# Patient Record
Sex: Female | Born: 1980 | Race: White | Hispanic: No | Marital: Married | Smoking: Never smoker
Health system: Southern US, Community
[De-identification: ages and names within clinical notes are randomized; demographics above are authoritative.]

## PROBLEM LIST (undated history)

## (undated) ENCOUNTER — Inpatient Hospital Stay (HOSPITAL_COMMUNITY): Payer: Self-pay

## (undated) DIAGNOSIS — R87619 Unspecified abnormal cytological findings in specimens from cervix uteri: Secondary | ICD-10-CM

## (undated) DIAGNOSIS — Z8619 Personal history of other infectious and parasitic diseases: Secondary | ICD-10-CM

## (undated) DIAGNOSIS — R87629 Unspecified abnormal cytological findings in specimens from vagina: Secondary | ICD-10-CM

## (undated) DIAGNOSIS — Z87442 Personal history of urinary calculi: Secondary | ICD-10-CM

## (undated) DIAGNOSIS — IMO0002 Reserved for concepts with insufficient information to code with codable children: Secondary | ICD-10-CM

## (undated) HISTORY — DX: Reserved for concepts with insufficient information to code with codable children: IMO0002

## (undated) HISTORY — DX: Unspecified abnormal cytological findings in specimens from cervix uteri: R87.619

## (undated) HISTORY — DX: Personal history of urinary calculi: Z87.442

---

## 2008-03-11 ENCOUNTER — Emergency Department: Payer: Self-pay | Admitting: Emergency Medicine

## 2008-03-11 ENCOUNTER — Other Ambulatory Visit: Payer: Self-pay

## 2010-04-22 ENCOUNTER — Ambulatory Visit: Payer: Self-pay | Admitting: Internal Medicine

## 2011-01-02 LAB — ABO/RH

## 2011-01-02 LAB — GC/CHLAMYDIA PROBE AMP, GENITAL: Gonorrhea: NEGATIVE

## 2011-01-02 LAB — RUBELLA ANTIBODY, IGM: Rubella: IMMUNE

## 2011-06-29 ENCOUNTER — Inpatient Hospital Stay (HOSPITAL_COMMUNITY): Admission: AD | Admit: 2011-06-29 | Payer: Self-pay | Source: Ambulatory Visit | Admitting: Obstetrics and Gynecology

## 2011-07-31 ENCOUNTER — Telehealth (HOSPITAL_COMMUNITY): Payer: Self-pay | Admitting: *Deleted

## 2011-07-31 ENCOUNTER — Encounter (HOSPITAL_COMMUNITY): Payer: Self-pay | Admitting: *Deleted

## 2011-07-31 NOTE — Telephone Encounter (Signed)
preadmission screen 

## 2011-08-06 ENCOUNTER — Inpatient Hospital Stay (HOSPITAL_COMMUNITY)
Admission: RE | Admit: 2011-08-06 | Discharge: 2011-08-10 | DRG: 371 | Disposition: A | Discharge status: Home / Self Care | Payer: BC Managed Care – PPO | Source: Ambulatory Visit | Attending: Obstetrics and Gynecology | Admitting: Obstetrics and Gynecology

## 2011-08-06 ENCOUNTER — Other Ambulatory Visit: Payer: Self-pay | Admitting: Obstetrics and Gynecology

## 2011-08-06 ENCOUNTER — Inpatient Hospital Stay (HOSPITAL_COMMUNITY): Admission: RE | Admit: 2011-08-06 | Payer: Self-pay | Source: Ambulatory Visit

## 2011-08-06 DIAGNOSIS — O324XX0 Maternal care for high head at term, not applicable or unspecified: Secondary | ICD-10-CM | POA: Diagnosis present

## 2011-08-06 DIAGNOSIS — Z22330 Carrier of Group B streptococcus: Secondary | ICD-10-CM

## 2011-08-06 DIAGNOSIS — O480 Post-term pregnancy: Principal | ICD-10-CM | POA: Diagnosis present

## 2011-08-06 DIAGNOSIS — O99892 Other specified diseases and conditions complicating childbirth: Secondary | ICD-10-CM | POA: Diagnosis present

## 2011-08-06 LAB — CBC
MCH: 31.4 pg (ref 26.0–34.0)
MCHC: 34.7 g/dL (ref 30.0–36.0)
MCV: 90.5 fL (ref 78.0–100.0)
Platelets: 142 10*3/uL — ABNORMAL LOW (ref 150–400)
RBC: 4.11 MIL/uL (ref 3.87–5.11)

## 2011-08-06 MED ORDER — OXYTOCIN BOLUS FROM INFUSION
500.0000 mL | Freq: Once | INTRAVENOUS | Status: DC
  Filled 2011-08-06: qty 500

## 2011-08-06 MED ORDER — OXYTOCIN 20 UNITS IN LACTATED RINGERS INFUSION - SIMPLE: 125.0000 mL/h | Freq: Once | INTRAVENOUS | Status: DC

## 2011-08-06 MED ORDER — ACETAMINOPHEN 325 MG PO TABS: 650.0000 mg | ORAL_TABLET | ORAL | Status: DC | PRN

## 2011-08-06 MED ORDER — ZOLPIDEM TARTRATE 10 MG PO TABS
10.0000 mg | ORAL_TABLET | Freq: Every evening | ORAL | Status: DC | PRN
  Administered 2011-08-06: 10 mg via ORAL
  Filled 2011-08-06: qty 1

## 2011-08-06 MED ORDER — PENICILLIN G POTASSIUM 5000000 UNITS IJ SOLR
5.0000 10*6.[IU] | Freq: Once | INTRAVENOUS | Status: DC
  Filled 2011-08-06: qty 5

## 2011-08-06 MED ORDER — PENICILLIN G POTASSIUM 5000000 UNITS IJ SOLR
2.5000 10*6.[IU] | INTRAVENOUS | Status: DC
  Filled 2011-08-06 (×5): qty 2.5

## 2011-08-06 MED ORDER — LACTATED RINGERS IV SOLN: 500.0000 mL | INTRAVENOUS | Status: DC | PRN

## 2011-08-06 MED ORDER — FLEET ENEMA 7-19 GM/118ML RE ENEM: 1.0000 | ENEMA | RECTAL | Status: DC | PRN

## 2011-08-06 MED ORDER — PENICILLIN G POTASSIUM 5000000 UNITS IJ SOLR
2.5000 10*6.[IU] | INTRAVENOUS | Status: DC
  Filled 2011-08-06 (×2): qty 2.5

## 2011-08-06 MED ORDER — MISOPROSTOL 25 MCG QUARTER TABLET
25.0000 ug | ORAL_TABLET | ORAL | Status: DC | PRN
  Administered 2011-08-06 – 2011-08-07 (×3): 25 ug via VAGINAL
  Filled 2011-08-06: qty 1
  Filled 2011-08-06 (×3): qty 0.25

## 2011-08-06 MED ORDER — ONDANSETRON HCL 4 MG/2ML IJ SOLN: 4.0000 mg | Freq: Four times a day (QID) | INTRAMUSCULAR | Status: DC | PRN

## 2011-08-06 MED ORDER — TERBUTALINE SULFATE 1 MG/ML IJ SOLN: 0.2500 mg | Freq: Once | INTRAMUSCULAR | Status: AC | PRN

## 2011-08-06 MED ORDER — LACTATED RINGERS IV SOLN
INTRAVENOUS | Status: DC
  Administered 2011-08-07 (×5): via INTRAVENOUS

## 2011-08-06 MED ORDER — IBUPROFEN 600 MG PO TABS: 600.0000 mg | ORAL_TABLET | Freq: Four times a day (QID) | ORAL | Status: DC | PRN

## 2011-08-06 MED ORDER — OXYCODONE-ACETAMINOPHEN 5-325 MG PO TABS: 2.0000 | ORAL_TABLET | ORAL | Status: DC | PRN

## 2011-08-06 MED ORDER — CITRIC ACID-SODIUM CITRATE 334-500 MG/5ML PO SOLN
30.0000 mL | ORAL | Status: DC | PRN
  Administered 2011-08-07: 30 mL via ORAL
  Filled 2011-08-06: qty 15

## 2011-08-07 ENCOUNTER — Other Ambulatory Visit: Payer: Self-pay | Admitting: Obstetrics and Gynecology

## 2011-08-07 ENCOUNTER — Encounter (HOSPITAL_COMMUNITY): Payer: Self-pay

## 2011-08-07 ENCOUNTER — Encounter (HOSPITAL_COMMUNITY)
Admission: RE | Disposition: A | Discharge status: Home / Self Care | Payer: Self-pay | Source: Ambulatory Visit | Attending: Obstetrics and Gynecology

## 2011-08-07 ENCOUNTER — Inpatient Hospital Stay (HOSPITAL_COMMUNITY): Payer: BC Managed Care – PPO | Admitting: Anesthesiology

## 2011-08-07 ENCOUNTER — Encounter (HOSPITAL_COMMUNITY): Payer: Self-pay | Admitting: Anesthesiology

## 2011-08-07 SURGERY — Surgical Case
Anesthesia: Epidural | Site: Abdomen | Wound class: Clean Contaminated

## 2011-08-07 MED ORDER — ONDANSETRON HCL 4 MG/2ML IJ SOLN
INTRAMUSCULAR | Status: DC | PRN
  Administered 2011-08-07: 4 mg via INTRAVENOUS

## 2011-08-07 MED ORDER — LIDOCAINE-EPINEPHRINE (PF) 2 %-1:200000 IJ SOLN
INTRAMUSCULAR | Status: AC
  Filled 2011-08-07: qty 20

## 2011-08-07 MED ORDER — PRENATAL PLUS 27-1 MG PO TABS: 1.0000 | ORAL_TABLET | Freq: Every day | ORAL | Status: DC

## 2011-08-07 MED ORDER — SCOPOLAMINE 1 MG/3DAYS TD PT72
1.0000 | MEDICATED_PATCH | Freq: Once | TRANSDERMAL | Status: DC
  Administered 2011-08-07: 1.5 mg via TRANSDERMAL

## 2011-08-07 MED ORDER — DEXTROSE 5 % IV SOLN
5.0000 10*6.[IU] | Freq: Once | INTRAVENOUS | Status: AC
  Administered 2011-08-07: 5 10*6.[IU] via INTRAVENOUS
  Filled 2011-08-07: qty 5

## 2011-08-07 MED ORDER — EPHEDRINE 5 MG/ML INJ
10.0000 mg | INTRAVENOUS | Status: DC | PRN
  Filled 2011-08-07 (×2): qty 4

## 2011-08-07 MED ORDER — DIPHENHYDRAMINE HCL 50 MG/ML IJ SOLN: 12.5000 mg | INTRAMUSCULAR | Status: DC | PRN

## 2011-08-07 MED ORDER — METOCLOPRAMIDE HCL 5 MG/ML IJ SOLN
INTRAMUSCULAR | Status: AC
  Administered 2011-08-07: 10 mg
  Filled 2011-08-07: qty 2

## 2011-08-07 MED ORDER — DIPHENHYDRAMINE HCL 50 MG/ML IJ SOLN: 25.0000 mg | INTRAMUSCULAR | Status: DC | PRN

## 2011-08-07 MED ORDER — LANOLIN HYDROUS EX OINT: 1.0000 "application " | TOPICAL_OINTMENT | CUTANEOUS | Status: DC | PRN

## 2011-08-07 MED ORDER — MEPERIDINE HCL 25 MG/ML IJ SOLN: 6.2500 mg | INTRAMUSCULAR | Status: DC | PRN

## 2011-08-07 MED ORDER — MORPHINE SULFATE 0.5 MG/ML IJ SOLN
INTRAMUSCULAR | Status: AC
  Filled 2011-08-07: qty 10

## 2011-08-07 MED ORDER — DIPHENHYDRAMINE HCL 25 MG PO CAPS: 25.0000 mg | ORAL_CAPSULE | Freq: Four times a day (QID) | ORAL | Status: DC | PRN

## 2011-08-07 MED ORDER — SODIUM BICARBONATE 8.4 % IV SOLN
INTRAVENOUS | Status: AC
  Filled 2011-08-07: qty 50

## 2011-08-07 MED ORDER — NALBUPHINE HCL 10 MG/ML IJ SOLN: 5.0000 mg | INTRAMUSCULAR | Status: DC | PRN

## 2011-08-07 MED ORDER — ONDANSETRON HCL 4 MG PO TABS: 4.0000 mg | ORAL_TABLET | ORAL | Status: DC | PRN

## 2011-08-07 MED ORDER — OXYTOCIN 20 UNITS IN LACTATED RINGERS INFUSION - SIMPLE
1.0000 m[IU]/min | INTRAVENOUS | Status: DC
  Administered 2011-08-07: 4 m[IU]/min via INTRAVENOUS
  Administered 2011-08-07: 8 m[IU]/min via INTRAVENOUS
  Administered 2011-08-07: 6 m[IU]/min via INTRAVENOUS
  Administered 2011-08-07: 2 m[IU]/min via INTRAVENOUS
  Filled 2011-08-07: qty 1000

## 2011-08-07 MED ORDER — OXYTOCIN 20 UNITS IN LACTATED RINGERS INFUSION - SIMPLE
INTRAVENOUS | Status: DC | PRN
  Administered 2011-08-07 (×2): 20 [IU] via INTRAVENOUS

## 2011-08-07 MED ORDER — SODIUM CHLORIDE 0.9 % IJ SOLN: 3.0000 mL | INTRAMUSCULAR | Status: DC | PRN

## 2011-08-07 MED ORDER — PHENYLEPHRINE 40 MCG/ML (10ML) SYRINGE FOR IV PUSH (FOR BLOOD PRESSURE SUPPORT)
80.0000 ug | PREFILLED_SYRINGE | INTRAVENOUS | Status: DC | PRN
  Filled 2011-08-07: qty 5

## 2011-08-07 MED ORDER — PHENYLEPHRINE 40 MCG/ML (10ML) SYRINGE FOR IV PUSH (FOR BLOOD PRESSURE SUPPORT)
80.0000 ug | PREFILLED_SYRINGE | INTRAVENOUS | Status: DC | PRN
  Filled 2011-08-07 (×2): qty 5

## 2011-08-07 MED ORDER — EPHEDRINE 5 MG/ML INJ
10.0000 mg | INTRAVENOUS | Status: DC | PRN
  Filled 2011-08-07: qty 4

## 2011-08-07 MED ORDER — METOCLOPRAMIDE HCL 5 MG/ML IJ SOLN: 10.0000 mg | Freq: Once | INTRAMUSCULAR | Status: DC

## 2011-08-07 MED ORDER — SODIUM BICARBONATE 8.4 % IV SOLN
INTRAVENOUS | Status: DC | PRN
  Administered 2011-08-07: 5 mL via EPIDURAL

## 2011-08-07 MED ORDER — DEXTROSE IN LACTATED RINGERS 5 % IV SOLN: INTRAVENOUS | Status: DC

## 2011-08-07 MED ORDER — MENTHOL 3 MG MT LOZG: 1.0000 | LOZENGE | OROMUCOSAL | Status: DC | PRN

## 2011-08-07 MED ORDER — OXYTOCIN 20 UNITS IN LACTATED RINGERS INFUSION - SIMPLE: 125.0000 mL/h | INTRAVENOUS | Status: AC

## 2011-08-07 MED ORDER — MEPERIDINE HCL 25 MG/ML IJ SOLN
INTRAMUSCULAR | Status: AC
  Filled 2011-08-07: qty 1

## 2011-08-07 MED ORDER — SENNOSIDES-DOCUSATE SODIUM 8.6-50 MG PO TABS
2.0000 | ORAL_TABLET | Freq: Every day | ORAL | Status: DC
  Administered 2011-08-08: 2 via ORAL

## 2011-08-07 MED ORDER — TERBUTALINE SULFATE 1 MG/ML IJ SOLN: 0.2500 mg | Freq: Once | INTRAMUSCULAR | Status: DC | PRN

## 2011-08-07 MED ORDER — CEFAZOLIN SODIUM 1-5 GM-% IV SOLN
INTRAVENOUS | Status: DC | PRN
  Administered 2011-08-07: 1 g via INTRAVENOUS

## 2011-08-07 MED ORDER — SIMETHICONE 80 MG PO CHEW
80.0000 mg | CHEWABLE_TABLET | Freq: Three times a day (TID) | ORAL | Status: DC
  Administered 2011-08-08 – 2011-08-09 (×7): 80 mg via ORAL

## 2011-08-07 MED ORDER — PRENATAL PLUS 27-1 MG PO TABS
1.0000 | ORAL_TABLET | Freq: Every day | ORAL | Status: DC
  Administered 2011-08-08 – 2011-08-10 (×3): 1 via ORAL
  Filled 2011-08-07 (×3): qty 1

## 2011-08-07 MED ORDER — PHENYLEPHRINE HCL 10 MG/ML IJ SOLN
INTRAMUSCULAR | Status: DC | PRN
  Administered 2011-08-07: 40 ug via INTRAVENOUS
  Administered 2011-08-07 (×2): 80 ug via INTRAVENOUS
  Administered 2011-08-07: 40 ug via INTRAVENOUS
  Administered 2011-08-07 (×2): 80 ug via INTRAVENOUS

## 2011-08-07 MED ORDER — NALOXONE HCL 0.4 MG/ML IJ SOLN: 0.4000 mg | INTRAMUSCULAR | Status: DC | PRN

## 2011-08-07 MED ORDER — OXYCODONE-ACETAMINOPHEN 5-325 MG PO TABS
1.0000 | ORAL_TABLET | ORAL | Status: DC | PRN
  Administered 2011-08-08 – 2011-08-10 (×13): 1 via ORAL
  Filled 2011-08-07: qty 1
  Filled 2011-08-07: qty 2
  Filled 2011-08-07 (×2): qty 1
  Filled 2011-08-07 (×2): qty 2
  Filled 2011-08-07 (×6): qty 1
  Filled 2011-08-07: qty 2
  Filled 2011-08-07: qty 1
  Filled 2011-08-07: qty 2
  Filled 2011-08-07: qty 1

## 2011-08-07 MED ORDER — HYDROMORPHONE HCL 1 MG/ML IJ SOLN: 0.2500 mg | INTRAMUSCULAR | Status: DC | PRN

## 2011-08-07 MED ORDER — MEPERIDINE HCL 25 MG/ML IJ SOLN
INTRAMUSCULAR | Status: DC | PRN
  Administered 2011-08-07 (×2): 6 mg via INTRAVENOUS
  Administered 2011-08-07: 7 mg via INTRAVENOUS
  Administered 2011-08-07: 6 mg via INTRAVENOUS

## 2011-08-07 MED ORDER — ONDANSETRON HCL 4 MG/2ML IJ SOLN
INTRAMUSCULAR | Status: AC
  Filled 2011-08-07: qty 2

## 2011-08-07 MED ORDER — DIPHENHYDRAMINE HCL 25 MG PO CAPS
25.0000 mg | ORAL_CAPSULE | ORAL | Status: DC | PRN
  Filled 2011-08-07: qty 1

## 2011-08-07 MED ORDER — KETOROLAC TROMETHAMINE 30 MG/ML IJ SOLN
30.0000 mg | Freq: Four times a day (QID) | INTRAMUSCULAR | Status: AC | PRN
  Filled 2011-08-07: qty 1

## 2011-08-07 MED ORDER — LIDOCAINE HCL 1.5 % IJ SOLN
INTRAMUSCULAR | Status: DC | PRN
  Administered 2011-08-07: 2 mL via EPIDURAL
  Administered 2011-08-07 (×2): 5 mL via EPIDURAL

## 2011-08-07 MED ORDER — MEDROXYPROGESTERONE ACETATE 150 MG/ML IM SUSP: 150.0000 mg | INTRAMUSCULAR | Status: DC | PRN

## 2011-08-07 MED ORDER — ONDANSETRON HCL 4 MG/2ML IJ SOLN: 4.0000 mg | INTRAMUSCULAR | Status: DC | PRN

## 2011-08-07 MED ORDER — KETOROLAC TROMETHAMINE 30 MG/ML IJ SOLN: 30.0000 mg | Freq: Four times a day (QID) | INTRAMUSCULAR | Status: AC | PRN

## 2011-08-07 MED ORDER — SCOPOLAMINE 1 MG/3DAYS TD PT72
MEDICATED_PATCH | TRANSDERMAL | Status: AC
  Administered 2011-08-07: 1.5 mg via TRANSDERMAL
  Filled 2011-08-07: qty 1

## 2011-08-07 MED ORDER — KETOROLAC TROMETHAMINE 30 MG/ML IJ SOLN: 15.0000 mg | Freq: Once | INTRAMUSCULAR | Status: DC | PRN

## 2011-08-07 MED ORDER — TETANUS-DIPHTH-ACELL PERTUSSIS 5-2.5-18.5 LF-MCG/0.5 IM SUSP
0.5000 mL | Freq: Once | INTRAMUSCULAR | Status: DC
  Filled 2011-08-07: qty 0.5

## 2011-08-07 MED ORDER — IBUPROFEN 600 MG PO TABS
600.0000 mg | ORAL_TABLET | Freq: Four times a day (QID) | ORAL | Status: DC | PRN
  Filled 2011-08-07 (×8): qty 1

## 2011-08-07 MED ORDER — OXYTOCIN 10 UNIT/ML IJ SOLN
INTRAMUSCULAR | Status: AC
  Filled 2011-08-07: qty 4

## 2011-08-07 MED ORDER — DIBUCAINE 1 % RE OINT: 1.0000 "application " | TOPICAL_OINTMENT | RECTAL | Status: DC | PRN

## 2011-08-07 MED ORDER — MORPHINE SULFATE (PF) 0.5 MG/ML IJ SOLN
INTRAMUSCULAR | Status: DC | PRN
  Administered 2011-08-07: 1 mg via INTRAVENOUS

## 2011-08-07 MED ORDER — PENICILLIN G POTASSIUM 5000000 UNITS IJ SOLR: 5.0000 10*6.[IU] | Freq: Once | INTRAMUSCULAR | Status: DC

## 2011-08-07 MED ORDER — KETOROLAC TROMETHAMINE 60 MG/2ML IM SOLN
60.0000 mg | Freq: Once | INTRAMUSCULAR | Status: AC | PRN
  Administered 2011-08-07: 60 mg via INTRAMUSCULAR

## 2011-08-07 MED ORDER — KETOROLAC TROMETHAMINE 60 MG/2ML IM SOLN
INTRAMUSCULAR | Status: AC
  Administered 2011-08-07: 60 mg via INTRAMUSCULAR
  Filled 2011-08-07: qty 2

## 2011-08-07 MED ORDER — PENICILLIN G POTASSIUM 5000000 UNITS IJ SOLR
5.0000 10*6.[IU] | Freq: Once | INTRAVENOUS | Status: DC
  Filled 2011-08-07: qty 5

## 2011-08-07 MED ORDER — SIMETHICONE 80 MG PO CHEW: 80.0000 mg | CHEWABLE_TABLET | ORAL | Status: DC | PRN

## 2011-08-07 MED ORDER — WITCH HAZEL-GLYCERIN EX PADS: 1.0000 "application " | MEDICATED_PAD | CUTANEOUS | Status: DC | PRN

## 2011-08-07 MED ORDER — FENTANYL 2.5 MCG/ML BUPIVACAINE 1/10 % EPIDURAL INFUSION (WH - ANES)
14.0000 mL/h | INTRAMUSCULAR | Status: DC
  Administered 2011-08-07 (×2): 14 mL/h via EPIDURAL
  Filled 2011-08-07 (×3): qty 60

## 2011-08-07 MED ORDER — CHLOROPROCAINE HCL 3 % IJ SOLN
INTRAMUSCULAR | Status: AC
  Filled 2011-08-07: qty 20

## 2011-08-07 MED ORDER — IBUPROFEN 600 MG PO TABS
600.0000 mg | ORAL_TABLET | Freq: Four times a day (QID) | ORAL | Status: DC
  Administered 2011-08-08 – 2011-08-10 (×8): 600 mg via ORAL

## 2011-08-07 MED ORDER — SODIUM CHLORIDE 0.9 % IV SOLN: 1.0000 ug/kg/h | INTRAVENOUS | Status: DC | PRN

## 2011-08-07 MED ORDER — PHENYLEPHRINE 40 MCG/ML (10ML) SYRINGE FOR IV PUSH (FOR BLOOD PRESSURE SUPPORT)
PREFILLED_SYRINGE | INTRAVENOUS | Status: AC
  Filled 2011-08-07: qty 10

## 2011-08-07 MED ORDER — CEFAZOLIN SODIUM 1-5 GM-% IV SOLN
INTRAVENOUS | Status: AC
  Filled 2011-08-07: qty 50

## 2011-08-07 MED ORDER — ONDANSETRON HCL 4 MG/2ML IJ SOLN: 4.0000 mg | Freq: Once | INTRAMUSCULAR | Status: DC | PRN

## 2011-08-07 MED ORDER — PENICILLIN G POTASSIUM 5000000 UNITS IJ SOLR
2.5000 10*6.[IU] | INTRAVENOUS | Status: DC
  Filled 2011-08-07 (×2): qty 2.5

## 2011-08-07 MED ORDER — ONDANSETRON HCL 4 MG/2ML IJ SOLN: 4.0000 mg | Freq: Three times a day (TID) | INTRAMUSCULAR | Status: DC | PRN

## 2011-08-07 MED ORDER — MORPHINE SULFATE (PF) 0.5 MG/ML IJ SOLN
INTRAMUSCULAR | Status: DC | PRN
  Administered 2011-08-07: 4 mg via EPIDURAL

## 2011-08-07 MED ORDER — MEASLES, MUMPS & RUBELLA VAC ~~LOC~~ INJ: 0.5000 mL | INJECTION | Freq: Once | SUBCUTANEOUS | Status: DC

## 2011-08-07 MED ORDER — PENICILLIN G POTASSIUM 5000000 UNITS IJ SOLR
2.5000 10*6.[IU] | INTRAVENOUS | Status: DC
  Administered 2011-08-07 (×2): 2.5 10*6.[IU] via INTRAVENOUS
  Filled 2011-08-07 (×5): qty 2.5

## 2011-08-07 MED ORDER — LACTATED RINGERS IV SOLN
500.0000 mL | Freq: Once | INTRAVENOUS | Status: AC
  Administered 2011-08-07: 11:00:00 via INTRAVENOUS

## 2011-08-07 SURGICAL SUPPLY — 25 items
CHLORAPREP W/TINT 26ML (MISCELLANEOUS) ×2 IMPLANT
CLOTH BEACON ORANGE TIMEOUT ST (SAFETY) ×2 IMPLANT
DRSG COVADERM 4X8 (GAUZE/BANDAGES/DRESSINGS) ×2 IMPLANT
ELECT REM PT RETURN 9FT ADLT (ELECTROSURGICAL) ×2
ELECTRODE REM PT RTRN 9FT ADLT (ELECTROSURGICAL) ×1 IMPLANT
EXTRACTOR VACUUM M CUP 4 TUBE (SUCTIONS) IMPLANT
GLOVE BIO SURGEON STRL SZ 6.5 (GLOVE) ×2 IMPLANT
GLOVE BIOGEL PI IND STRL 7.0 (GLOVE) ×2 IMPLANT
GLOVE BIOGEL PI INDICATOR 7.0 (GLOVE) ×2
GOWN PREVENTION PLUS LG XLONG (DISPOSABLE) ×4 IMPLANT
KIT ABG SYR 3ML LUER SLIP (SYRINGE) ×2 IMPLANT
NEEDLE HYPO 25X5/8 SAFETYGLIDE (NEEDLE) ×2 IMPLANT
NS IRRIG 1000ML POUR BTL (IV SOLUTION) ×2 IMPLANT
PACK C SECTION WH (CUSTOM PROCEDURE TRAY) ×2 IMPLANT
SLEEVE SCD COMPRESS KNEE MED (MISCELLANEOUS) ×2 IMPLANT
STAPLER VISISTAT 35W (STAPLE) ×2 IMPLANT
SUT CHROMIC 0 CT 802H (SUTURE) IMPLANT
SUT CHROMIC 0 CTX 36 (SUTURE) ×6 IMPLANT
SUT MNCRL AB 3-0 PS2 27 (SUTURE) IMPLANT
SUT MON AB-0 CT1 36 (SUTURE) ×2 IMPLANT
SUT PDS AB 0 CTX 60 (SUTURE) ×2 IMPLANT
SUT PLAIN 0 NONE (SUTURE) IMPLANT
TOWEL OR 17X24 6PK STRL BLUE (TOWEL DISPOSABLE) ×4 IMPLANT
TRAY FOLEY CATH 14FR (SET/KITS/TRAYS/PACK) IMPLANT
WATER STERILE IRR 1000ML POUR (IV SOLUTION) IMPLANT

## 2011-08-07 NOTE — Progress Notes (Signed)
Comfortable w/ epidural  FHT reassuring  Toco Q2-3 Cvx 8cm by RN exam  A/P:  Exp mngt

## 2011-08-07 NOTE — Progress Notes (Signed)
Comfortable w/ epidural.  FHT reassuring w/ occasional early decel Toco Q3-4 Cvx 4.5cm per RN exam  A/P:  Exp mngt.

## 2011-08-07 NOTE — Transfer of Care (Signed)
Immediate Anesthesia Transfer of Care Note  Patient: Emily Mendez  Procedure(s) Performed:  CESAREAN SECTION  Patient Location: PACU  Anesthesia Type: Epidural  Level of Consciousness: awake, alert  and oriented  Airway & Oxygen Therapy: Patient Spontanous Breathing  Post-op Assessment: Report given to PACU RN and Post -op Vital signs reviewed and stable  Post vital signs: Reviewed and stable  Complications: No apparent anesthesia complications

## 2011-08-07 NOTE — Progress Notes (Addendum)
Dr. Renaldo Fiddler at bedside discussing c-section. Pt and family educated. Pt agreed

## 2011-08-07 NOTE — Progress Notes (Signed)
Pt w/ mild back cramps w/ ctx.  No lof or vb.    FHT reassuring toco irregular Cvx 3/80/-2 AROM - clear  A/P:  Start pitocin, PCN Exp mngt

## 2011-08-07 NOTE — Plan of Care (Signed)
Problem: Consults Goal: Birthing Suites Patient Information Press F2 to bring up selections list  Outcome: Completed/Met Date Met:  08/07/11  Pt > [redacted] weeks EGA

## 2011-08-07 NOTE — Consult Note (Signed)
Neonatology Note:   Attendance at C-section:    I was asked to attend this primary C/S at term due to failure of descent. The mother is a G1P0 A pos, GBS pos who received Pen G more than 4 hours PTD. Mother was afebrile during labor. ROM 10 hours PTD, fluid clear. Infant vigorous with good spontaneous cry and tone. Needed only minimal bulb suctioning. Ap 9/9. Lungs clear to ausc in DR. To CN to care of Pediatrician.   Deatra James, MD

## 2011-08-07 NOTE — Anesthesia Preprocedure Evaluation (Signed)
Anesthesia Evaluation  Name, MR# and DOB Patient awake  General Assessment Comment  Reviewed: Allergy & Precautions, H&P , NPO status , Patient's Chart, lab work & pertinent test results, reviewed documented beta blocker date and time   History of Anesthesia Complications Negative for: history of anesthetic complications  Airway Mallampati: II TM Distance: >3 FB Neck ROM: full    Dental  (+) Teeth Intact   Pulmonary  clear to auscultation  breath sounds clear to auscultation none    Cardiovascular regular Normal    Neuro/Psych Negative Neurological ROS  Negative Psych ROS  GI/Hepatic/Renal negative GI ROS  negative Liver ROS  negative Renal ROS        Endo/Other  Negative Endocrine ROS (+)      Abdominal   Musculoskeletal   Hematology negative hematology ROS (+)   Peds  Reproductive/Obstetrics (+) Pregnancy    Anesthesia Other Findings             Anesthesia Physical Anesthesia Plan  ASA: II  Anesthesia Plan: Epidural   Post-op Pain Management:    Induction:   Airway Management Planned:   Additional Equipment:   Intra-op Plan:   Post-operative Plan:   Informed Consent: I have reviewed the patients History and Physical, chart, labs and discussed the procedure including the risks, benefits and alternatives for the proposed anesthesia with the patient or authorized representative who has indicated his/her understanding and acceptance.     Plan Discussed with:   Anesthesia Plan Comments:         Anesthesia Quick Evaluation  

## 2011-08-07 NOTE — Progress Notes (Signed)
Comfortable w/ epidural.  Pushing x 1hr  FHT reassuring  Toco Q2-3 Cvx C/C/-1 station, no change in fetal station despite 1hr pushing  A/p:  Recheck in 20-30min

## 2011-08-07 NOTE — Progress Notes (Signed)
Comfortable w/ epidural.  Mild nausea  FHT reassuring Toco Q2-3 Cvx c/c/-1  A/P:  Start pushing

## 2011-08-07 NOTE — H&P (Signed)
30 yo G1P0 @ 40+ wks presents for postdates IOL.  Pregnancy uncomplicated.  Past history:  See hollister, GBS +  AF, VSS Gen:  NAD Abd:  Gravid, NT Ext:  NT Cvx 1-2cm  A/P:  Admit Cytotec induction

## 2011-08-07 NOTE — Anesthesia Procedure Notes (Signed)
Epidural Patient location during procedure: OB Start time: 08/07/2011 11:57 AM Reason for block: procedure for pain  Staffing Performed by: anesthesiologist   Preanesthetic Checklist Completed: patient identified, site marked, surgical consent, pre-op evaluation, timeout performed, IV checked, risks and benefits discussed and monitors and equipment checked  Epidural Patient position: sitting Prep: site prepped and draped and DuraPrep Patient monitoring: continuous pulse ox and blood pressure Approach: midline Injection technique: LOR air  Needle:  Needle type: Tuohy  Needle gauge: 17 G Needle length: 9 cm Needle insertion depth: 5 cm cm Catheter type: closed end flexible Catheter size: 19 Gauge Catheter at skin depth: 10 cm Test dose: negative  Assessment Events: blood not aspirated, injection not painful, no injection resistance, negative IV test and no paresthesia

## 2011-08-07 NOTE — Anesthesia Postprocedure Evaluation (Signed)
Anesthesia Post Note  Patient: Emily Mendez  Procedure(s) Performed:  CESAREAN SECTION  Anesthesia type: Spinal  Patient location: PACU  Post pain: Pain level controlled  Post assessment: Post-op Vital signs reviewed  Last Vitals:  Filed Vitals:   08/07/11 2110  BP:   Pulse: 72  Temp:   Resp: 13    Post vital signs: Reviewed  Level of consciousness: awake  Complications: No apparent anesthesia complications

## 2011-08-07 NOTE — Op Note (Signed)
Cesarean Section Procedure Note   Emily Mendez  08/07/2011  Indications: Arrest of descent   Pre-operative Diagnosis: Arrest of Descent.   Post-operative Diagnosis: Same   Surgeon: Surgeon(s) and Role:    Zelphia Cairo - Primary   Assistants: none  Anesthesia: epidural   Procedure Details:  The patient was seen in the Holding Room. The risks, benefits, complications, treatment options, and expected outcomes were discussed with the patient. The patient concurred with the proposed plan, giving informed consent. identified as Emily Mendez and the procedure verified as C-Section Delivery. A Time Out was held and the above information confirmed.  After induction of anesthesia, the patient was draped and prepped in the usual sterile manner. A transverse was made and carried down through the subcutaneous tissue to the fascia. Fascial incision was made and extended transversely. The fascia was separated from the underlying rectus tissue superiorly and inferiorly. The peritoneum was identified and entered. Peritoneal incision was extended longitudinally. The utero-vesical peritoneal reflection was incised transversely and the bladder flap was bluntly freed from the lower uterine segment.  Bladder blade was inserted.   A low transverse uterine incision was made. Delivered from cephalic presentation was a viable female infant with Apgar scores of 9 at one minute and 9 at five minutes. Cord ph was sent the umbilical cord was clamped and cut cord blood was obtained for evaluation. The placenta was removed Intact and appeared normal. The uterine outline, tubes and ovaries appeared normal}. The uterine incision was closed with running locked sutures of 0chromic gut.   Hemostasis was observed. Lavage was carried out until clear. The fascia was then reapproximated with running sutures of . The skin was closed with staples.   Instrument, sponge, and needle counts were correct prior the abdominal  closure and were correct at the conclusion of the case.    Findings: vigerous female infant w/ apgars of 9,9.  Normal pelvic anatomy   Estimated Blood Loss: 700cc  Urine Output: mild hematuria but clear in the tubing  Specimens:  Specimens    None       Complications: no complications  Disposition: PACU - hemodynamically stable.   Maternal Condition: stable   Baby condition / location:  nursery-stable  Attending Attestation: I was present and scrubbed for the entire procedure.   Signed: Surgeon(s): Zelphia Cairo

## 2011-08-08 LAB — CBC
HCT: 31.4 % — ABNORMAL LOW (ref 36.0–46.0)
Hemoglobin: 10.9 g/dL — ABNORMAL LOW (ref 12.0–15.0)
MCH: 31.8 pg (ref 26.0–34.0)
MCV: 91.5 fL (ref 78.0–100.0)
RBC: 3.43 MIL/uL — ABNORMAL LOW (ref 3.87–5.11)

## 2011-08-08 NOTE — Anesthesia Postprocedure Evaluation (Signed)
  Anesthesia Post-op Note  Patient: Emily Mendez  Procedure(s) Performed:  CESAREAN SECTION  Patient Location: PACU and Mother/Baby  Anesthesia Type: Epidural  Level of Consciousness: awake, alert  and oriented  Airway and Oxygen Therapy: Patient Spontanous Breathing  Post-op Pain:   Post-op Assessment: Post-op Vital signs reviewed  Post-op Vital Signs: Reviewed and stable  Complications: No apparent anesthesia complications

## 2011-08-08 NOTE — Progress Notes (Signed)
Encounter addended by: Edison Pace, CRNA on: 08/08/2011  8:55 AM<BR>     Documentation filed: Notes Section

## 2011-08-08 NOTE — Anesthesia Postprocedure Evaluation (Signed)
  Anesthesia Post-op Note  Patient: Emily Mendez  Procedure(s) Performed:  CESAREAN SECTION  Patient Location: PACU and Mother/Baby  Anesthesia Type: Epidural  Level of Consciousness: awake, alert  and oriented  Airway and Oxygen Therapy: Patient Spontanous Breathing  Post-op Assessment: Post-op Vital signs reviewed  Post-op Vital Signs: Reviewed and stable  Complications: No apparent anesthesia complications

## 2011-08-08 NOTE — Progress Notes (Signed)
Post Partum Day 1 Subjective: no complaints, up ad lib and tolerating PO  Objective: Blood pressure 109/67, pulse 75, temperature 98.7 F (37.1 C), temperature source Oral, resp. rate 15, height 5\' 7"  (1.702 m), weight 95.255 kg (210 lb), SpO2 96.00%, unknown if currently breastfeeding.  Physical Exam:  General: alert and cooperative Lochia: appropriate Uterine Fundus: firm Incision: no significant drainage DVT Evaluation: No evidence of DVT seen on physical exam.   Basename 08/08/11 0542 08/06/11 2145  HGB 10.9* 12.9  HCT 31.4* 37.2    Assessment/Plan: Discharge home, Breastfeeding and Circumcision prior to discharge   LOS: 2 days   Emily Mendez 08/08/2011, 9:20 AM

## 2011-08-09 NOTE — Progress Notes (Signed)
Subjective: Postpartum Day 2: Cesarean Delivery Patient reports tolerating PO and no problems voiding.    Objective: Vital signs in last 24 hours: Temp:  [97.8 F (36.6 C)-98.6 F (37 C)] 97.9 F (36.6 C) (09/08 1930) Pulse Rate:  [79-87] 87  (09/08 1930) Resp:  [18-22] 20  (09/08 1930) BP: (106-113)/(63-68) 108/67 mmHg (09/08 1930) SpO2:  [96 %] 96 % (09/08 1517)  Physical Exam:  General: alert and cooperative Lochia: appropriate Uterine Fundus: firm Incision: healing well DVT Evaluation: No evidence of DVT seen on physical exam.   Basename 08/08/11 0542 08/06/11 2145  HGB 10.9* 12.9  HCT 31.4* 37.2    Assessment/Plan: Status post Cesarean section. Doing well postoperatively.  Continue current care.  Emily Mendez 08/09/2011, 9:40 AM

## 2011-08-10 MED ORDER — OXYCODONE-ACETAMINOPHEN 5-325 MG PO TABS: 1.0000 | ORAL_TABLET | ORAL | Status: AC | PRN

## 2011-08-10 MED ORDER — IBUPROFEN 600 MG PO TABS: 600.0000 mg | ORAL_TABLET | Freq: Four times a day (QID) | ORAL | Status: AC | PRN

## 2011-08-10 NOTE — Discharge Summary (Signed)
Obstetric Discharge Summary Reason for Admission: induction of labor Prenatal Procedures: none Intrapartum Procedures: cesarean: low cervical, transverse Postpartum Procedures: none Complications-Operative and Postpartum: none Hemoglobin  Date Value Range Status  08/08/2011 10.9* 12.0-15.0 (g/dL) Final     HCT  Date Value Range Status  08/08/2011 31.4* 36.0-46.0 (%) Final    Discharge Diagnoses: Term Pregnancy-delivered  Discharge Information: Date: 08/10/2011 Activity: pelvic rest Diet: routine Medications: PNV, Ibuprophen and Percocet Condition: stable Instructions: refer to practice specific booklet Discharge to: home   Newborn Data: Live born female  Birth Weight: 8 lb 4.3 oz (3750 g) APGAR: 9, 9  Home with mother.  Calel Pisarski G 08/10/2011, 8:44 AM

## 2011-08-10 NOTE — Progress Notes (Signed)
Subjective: Postpartum Day 3: Cesarean Delivery Patient reports tolerating PO, + flatus and no problems voiding.    Objective: Vital signs in last 24 hours: Temp:  [98.1 F (36.7 C)-98.3 F (36.8 C)] 98.3 F (36.8 C) (09/10 0528) Pulse Rate:  [70-90] 70  (09/10 0528) Resp:  [18] 18  (09/10 0528) BP: (123-130)/(76-80) 123/76 mmHg (09/10 0528)  Physical Exam:  General: alert and cooperative Lochia: appropriate Uterine Fundus: firm Incision: healing well DVT Evaluation: No evidence of DVT seen on physical exam.   Basename 08/08/11 0542  HGB 10.9*  HCT 31.4*    Assessment/Plan: Status post Cesarean section. Doing well postoperatively.  Discharge home with standard precautions and return to clinic in 1-2 weeks.  Emily Mendez G 08/10/2011, 8:31 AM

## 2011-08-26 ENCOUNTER — Encounter (HOSPITAL_COMMUNITY): Payer: Self-pay | Admitting: Obstetrics and Gynecology

## 2014-10-01 ENCOUNTER — Encounter (HOSPITAL_COMMUNITY): Payer: Self-pay | Admitting: Obstetrics and Gynecology

## 2014-10-12 ENCOUNTER — Other Ambulatory Visit: Payer: Self-pay | Admitting: Obstetrics and Gynecology

## 2014-10-15 LAB — CYTOLOGY - PAP

## 2015-05-13 ENCOUNTER — Encounter: Payer: Self-pay | Admitting: Urgent Care

## 2015-05-13 ENCOUNTER — Emergency Department: Payer: BLUE CROSS/BLUE SHIELD

## 2015-05-13 ENCOUNTER — Other Ambulatory Visit: Payer: Self-pay

## 2015-05-13 ENCOUNTER — Emergency Department
Admission: EM | Admit: 2015-05-13 | Discharge: 2015-05-13 | Disposition: A | Discharge status: Home / Self Care | Payer: BLUE CROSS/BLUE SHIELD | Attending: Emergency Medicine | Admitting: Emergency Medicine

## 2015-05-13 DIAGNOSIS — R0789 Other chest pain: Secondary | ICD-10-CM

## 2015-05-13 DIAGNOSIS — Z79899 Other long term (current) drug therapy: Secondary | ICD-10-CM | POA: Insufficient documentation

## 2015-05-13 DIAGNOSIS — R079 Chest pain, unspecified: Secondary | ICD-10-CM | POA: Diagnosis present

## 2015-05-13 LAB — CBC
HCT: 41.9 % (ref 35.0–47.0)
HEMOGLOBIN: 14 g/dL (ref 12.0–16.0)
MCH: 29.3 pg (ref 26.0–34.0)
MCHC: 33.4 g/dL (ref 32.0–36.0)
MCV: 87.5 fL (ref 80.0–100.0)
Platelets: 232 10*3/uL (ref 150–440)
RBC: 4.79 MIL/uL (ref 3.80–5.20)
RDW: 12.6 % (ref 11.5–14.5)
WBC: 8.5 10*3/uL (ref 3.6–11.0)

## 2015-05-13 LAB — BASIC METABOLIC PANEL
ANION GAP: 5 (ref 5–15)
BUN: 11 mg/dL (ref 6–20)
CALCIUM: 9.3 mg/dL (ref 8.9–10.3)
CHLORIDE: 105 mmol/L (ref 101–111)
CO2: 28 mmol/L (ref 22–32)
Creatinine, Ser: 0.79 mg/dL (ref 0.44–1.00)
GFR calc non Af Amer: >60 mL/min (ref >60)
Glucose, Bld: 129 mg/dL — ABNORMAL HIGH (ref 65–99)
Potassium: 3.7 mmol/L (ref 3.5–5.1)
SODIUM: 138 mmol/L (ref 135–145)

## 2015-05-13 LAB — TROPONIN I

## 2015-05-13 LAB — FIBRIN DERIVATIVES D-DIMER (ARMC ONLY): Fibrin derivatives D-dimer (ARMC): 425.8 (ref 0–499)

## 2015-05-13 MED ORDER — ASPIRIN 81 MG PO CHEW
CHEWABLE_TABLET | ORAL | Status: AC
  Administered 2015-05-13: 324 mg via ORAL
  Filled 2015-05-13: qty 4

## 2015-05-13 MED ORDER — ASPIRIN 81 MG PO CHEW
324.0000 mg | CHEWABLE_TABLET | Freq: Once | ORAL | Status: AC
  Administered 2015-05-13: 324 mg via ORAL

## 2015-05-13 NOTE — Discharge Instructions (Signed)
Chest Pain (Nonspecific)  Please follow-up with cardiology in the next 1-2 days. Return to the ER right away should you have persistent chest pain, severe chest pain, trouble breathing, weakness, nausea, vomiting or any other new concerns or symptoms arise.  It is often hard to give a specific diagnosis for the cause of chest pain. There is always a chance that your pain could be related to something serious, such as a heart attack or a blood clot in the lungs. You need to follow up with your health care provider for further evaluation. CAUSES   Heartburn.  Pneumonia or bronchitis.  Anxiety or stress.  Inflammation around your heart (pericarditis) or lung (pleuritis or pleurisy).  A blood clot in the lung.  A collapsed lung (pneumothorax). It can develop suddenly on its own (spontaneous pneumothorax) or from trauma to the chest.  Shingles infection (herpes zoster virus). The chest wall is composed of bones, muscles, and cartilage. Any of these can be the source of the pain.  The bones can be bruised by injury.  The muscles or cartilage can be strained by coughing or overwork.  The cartilage can be affected by inflammation and become sore (costochondritis). DIAGNOSIS  Lab tests or other studies may be needed to find the cause of your pain. Your health care provider may have you take a test called an ambulatory electrocardiogram (ECG). An ECG records your heartbeat patterns over a 24-hour period. You may also have other tests, such as:  Transthoracic echocardiogram (TTE). During echocardiography, sound waves are used to evaluate how blood flows through your heart.  Transesophageal echocardiogram (TEE).  Cardiac monitoring. This allows your health care provider to monitor your heart rate and rhythm in real time.  Holter monitor. This is a portable device that records your heartbeat and can help diagnose heart arrhythmias. It allows your health care provider to track your heart  activity for several days, if needed.  Stress tests by exercise or by giving medicine that makes the heart beat faster. TREATMENT   Treatment depends on what may be causing your chest pain. Treatment may include:  Acid blockers for heartburn.  Anti-inflammatory medicine.  Pain medicine for inflammatory conditions.  Antibiotics if an infection is present.  You may be advised to change lifestyle habits. This includes stopping smoking and avoiding alcohol, caffeine, and chocolate.  You may be advised to keep your head raised (elevated) when sleeping. This reduces the chance of acid going backward from your stomach into your esophagus. Most of the time, nonspecific chest pain will improve within 2-3 days with rest and mild pain medicine.  HOME CARE INSTRUCTIONS   If antibiotics were prescribed, take them as directed. Finish them even if you start to feel better.  For the next few days, avoid physical activities that bring on chest pain. Continue physical activities as directed.  Do not use any tobacco products, including cigarettes, chewing tobacco, or electronic cigarettes.  Avoid drinking alcohol.  Only take medicine as directed by your health care provider.  Follow your health care provider's suggestions for further testing if your chest pain does not go away.  Keep any follow-up appointments you made. If you do not go to an appointment, you could develop lasting (chronic) problems with pain. If there is any problem keeping an appointment, call to reschedule. SEEK MEDICAL CARE IF:   Your chest pain does not go away, even after treatment.  You have a rash with blisters on your chest.  You have a fever.  SEEK IMMEDIATE MEDICAL CARE IF:   You have increased chest pain or pain that spreads to your arm, neck, jaw, back, or abdomen.  You have shortness of breath.  You have an increasing cough, or you cough up blood.  You have severe back or abdominal pain.  You feel  nauseous or vomit.  You have severe weakness.  You faint.  You have chills. This is an emergency. Do not wait to see if the pain will go away. Get medical help at once. Call your local emergency services (911 in [place] Do not drive yourself to the hospital. MAKE SURE YOU:   Understand these instructions.  Will watch your condition.  Will get help right away if you are not doing well or get worse. Document Released: 08/26/2005 Document Revised: 11/21/2013 Document Reviewed: 06/21/2008 [facility] Patient Information 2015 [place], [place]. This information is not intended to replace advice given to you by your health care provider. Make sure you discuss any questions you have with your health care provider.

## 2015-05-13 NOTE — ED Provider Notes (Signed)
----------------------------------------- 9:56 PM on 05/13/2015 -----------------------------------------  [facility] Emergency Department Provider Note  ____________________________________________  Time seen: Approximately 9:56 PM  I have reviewed the triage vital signs and the nursing notes.   HISTORY  Chief Complaint Chest Pain    HPI Emily Mendez is a 34 y.o. female who is been experiencing brief, sharp pains in her left chest that last a few seconds since this afternoon. She believes this is like a "muscle cramp" and it comes and goes at times especially with using the left arm. There is no persistent or heavy chest pressure. No nausea or vomiting. No difficulty breathing.  Pain is in the left chest, mild, worse with moving the left arm, sharp and lasts less than one to 2 seconds. Not worse with deep breathing.  Patient has not had any recent travel, no major trauma, no leg swelling, no history of blood clots, no cough, no trouble breathing, she does take birth control. She is not pregnant.   Past Medical History  Diagnosis Date  . Abnormal Pap smear   . History of kidney stones     Patient Active Problem List   Diagnosis Date Noted  . Cesarean delivery delivered 08/08/2011    Past Surgical History  Procedure Laterality Date  . Cesarean section  08/07/2011    Procedure: CESAREAN SECTION;  Surgeon: Marylynn Pearson;  Location: [facility];  Service: Gynecology;  Laterality: N/A;    Current Outpatient Rx  Name  Route  Sig  Dispense  Refill  . prenatal vitamin w/FE, FA (PRENATAL 1 + 1) 27-1 MG TABS   Oral   Take 1 tablet by mouth daily.             Allergies Review of patient's allergies indicates no known allergies.  Family History  Problem Relation Age of Onset  . Colitis Mother   . Hypertension Father   . Nephrolithiasis Paternal Grandfather   no family history of heart disease, describes her family is "healthy"  Social  History History  Substance Use Topics  . Smoking status: Never Smoker   . Smokeless tobacco: Not on file  . Alcohol Use: No   Is not smoke, drink, or use drugs Review of Systems Constitutional: No fever/chills Eyes: No visual changes. ENT: No sore throat. Cardiovascular: See history of present illness Respiratory: Denies shortness of breath. Gastrointestinal: No abdominal pain.  No nausea, no vomiting.  No diarrhea.  No constipation. Genitourinary: Negative for dysuria. Musculoskeletal: Negative for back pain. Skin: Negative for rash. Neurological: Negative for headaches, focal weakness or numbness.  10-point ROS otherwise negative.  ____________________________________________   PHYSICAL EXAM:  VITAL SIGNS: ED Triage Vitals  Enc Vitals Group     BP 05/13/15 2051 135/80 mmHg     Pulse Rate 05/13/15 2051 88     Resp 05/13/15 2051 18     Temp 05/13/15 2051 98 F (36.7 C)     Temp Source 05/13/15 2051 Oral     SpO2 05/13/15 2051 98 %     Weight 05/13/15 2049 175 lb (79.379 kg)     Height 05/13/15 2049 5\' 7"  (1.702 m)     Head Cir --      Peak Flow --      Pain Score 05/13/15 2050 0     Pain Loc --      Pain Edu? --      Excl. in [place]? --     Constitutional: Alert and oriented. Well appearing and in  no acute distress. Eyes: Conjunctivae are normal. PERRL. EOMI. Head: Atraumatic. Nose: No congestion/rhinnorhea. Mouth/Throat: Mucous membranes are moist.  Oropharynx non-erythematous. Neck: No stridor.   Cardiovascular: Normal rate, regular rhythm. Grossly normal heart sounds.  Good peripheral circulation. Patient does have some reproducible tenderness over the left pectoralis muscle. She also states that her pain is worse when I have her abduct her left arm. This reproduces her pain. Respiratory: Normal respiratory effort.  No retractions. Lungs CTAB. Gastrointestinal: Soft and nontender. No distention. No abdominal bruits. No CVA tenderness. Musculoskeletal: No lower  extremity tenderness nor edema.  No joint effusions. Neurologic:  Normal speech and language. No gross focal neurologic deficits are appreciated. Speech is normal. No gait instability. ED and ulnar and radial nerve intact on the left. 5 out of 5 strength in all extremities. No sensory deficits. Skin:  Skin is warm, dry and intact. No rash noted. Psychiatric: Mood and affect are normal. Speech and behavior are normal.  ____________________________________________   LABS (all labs ordered are listed, but only abnormal results are displayed)  Labs Reviewed  BASIC METABOLIC PANEL - Abnormal; Notable for the following:    Glucose, Bld 129 (*)    All other components within normal limits  CBC  TROPONIN I  FIBRIN DERIVATIVES D-DIMER ([facility] ONLY)   ____________________________________________  EKG  ED ECG REPORT I, QUALE, MARK, the attending physician, personally viewed and interpreted this ECG.  Date: 05/13/2015 EKG Time: 2055 Rate: 80 Rhythm: normal sinus rhythm QRS Axis: normal Intervals: normal ST/T Wave abnormalities: normal Conduction Disutrbances: none Narrative Interpretation: unremarkable  ____________________________________________  RADIOLOGY  CLINICAL DATA: Acute onset of mid sternal chest pain and left arm numbness. Initial encounter.  EXAM: CHEST 2 VIEW  COMPARISON: None.  FINDINGS: The lungs are well-aerated and clear. There is no evidence of focal opacification, pleural effusion or pneumothorax.  The heart is normal in size; the mediastinal contour is within normal limits. No acute osseous abnormalities are seen.  IMPRESSION: No acute cardiopulmonary process seen. ____________________________________________   PROCEDURES  Procedure(s) performed: None  Critical Care performed: No  ____________________________________________   INITIAL IMPRESSION / ASSESSMENT AND PLAN / ED COURSE  Pertinent labs & imaging results that were available during  my care of the patient were reviewed by me and considered in my medical decision making (see chart for details).  This is a very pleasant 34 year old female presented experiencing chest pain since this afternoon. She has no coronary risk factors, a normal EKG, and a negative troponin. She is deemed low risk by heart score. In addition, her symptoms are very atypical of ACS with very brief 1-2 second sharp pain in the left chest mostly with use of the arm. I find that she likely has musculoskeletal chest pain, however based on her as region use we will obtain a chest x-ray and d-dimer to rule out pulmonary embolism. No history or symptoms to suggest dissection. Physical exam and neurologic exam are completely normal.  Based on the patient's extremely low risk for cardiac disease, I anticipate discharge to home. I will refer her to cardiology for follow-up purposes. I did discuss return precautions with both the patient and her husband and they're both agreeable with plan.  ----------------------------------------- 12:02 AM on 05/14/2015 -----------------------------------------  I reevaluated the patient about 20 minutes ago. She reported she is pain and symptom-free. Her labs are reassuring, chest x-ray normal, d-dimer less than 500.  At this point we'll discharge the patient home. I discussed close return precautions with her  and she'll follow-up with cardiology. ____________________________________________   FINAL CLINICAL IMPRESSION(S) / ED DIAGNOSES  Final diagnoses:  Chest pain, musculoskeletal      Delman Kitten, MD 05/14/15 0002

## 2015-05-13 NOTE — ED Notes (Signed)
Patient presents with c/o retrosternal CP that began this afternoon while she was sitting at her desk. Denies N/V, SOB and diaphoresis. (+) LUE numbness also reported.

## 2015-05-13 NOTE — ED Notes (Signed)
Ambulated to xray.  No SOB/ DOE.  Skin warm and dry.  NAD

## 2015-05-13 NOTE — ED Notes (Signed)
Patient anxious.  Emotional support given and well received.  Discussed immediate plan of care and expectations of visit.

## 2015-10-18 ENCOUNTER — Ambulatory Visit
Admission: RE | Admit: 2015-10-18 | Discharge: 2015-10-18 | Disposition: A | Discharge status: Home / Self Care | Payer: PRIVATE HEALTH INSURANCE | Source: Ambulatory Visit | Attending: Obstetrics and Gynecology | Admitting: Obstetrics and Gynecology

## 2015-10-18 ENCOUNTER — Other Ambulatory Visit: Payer: Self-pay | Admitting: Obstetrics and Gynecology

## 2015-10-18 DIAGNOSIS — N6489 Other specified disorders of breast: Secondary | ICD-10-CM

## 2015-10-18 DIAGNOSIS — N6320 Unspecified lump in the left breast, unspecified quadrant: Secondary | ICD-10-CM

## 2015-10-29 ENCOUNTER — Other Ambulatory Visit: Payer: BLUE CROSS/BLUE SHIELD

## 2016-07-29 LAB — OB RESULTS CONSOLE ABO/RH: RH Type: POSITIVE

## 2016-07-29 LAB — OB RESULTS CONSOLE GC/CHLAMYDIA
Chlamydia: NEGATIVE
Gonorrhea: NEGATIVE

## 2016-07-29 LAB — OB RESULTS CONSOLE RPR: RPR: NONREACTIVE

## 2016-07-29 LAB — OB RESULTS CONSOLE RUBELLA ANTIBODY, IGM: RUBELLA: IMMUNE

## 2016-07-29 LAB — OB RESULTS CONSOLE HEPATITIS B SURFACE ANTIGEN: HEP B S AG: NEGATIVE

## 2016-07-29 LAB — OB RESULTS CONSOLE ANTIBODY SCREEN: ANTIBODY SCREEN: NEGATIVE

## 2016-07-29 LAB — OB RESULTS CONSOLE HIV ANTIBODY (ROUTINE TESTING): HIV: NONREACTIVE

## 2016-08-02 ENCOUNTER — Encounter (HOSPITAL_COMMUNITY): Payer: Self-pay | Admitting: *Deleted

## 2016-08-02 ENCOUNTER — Inpatient Hospital Stay (HOSPITAL_COMMUNITY): Payer: BLUE CROSS/BLUE SHIELD

## 2016-08-02 ENCOUNTER — Inpatient Hospital Stay (HOSPITAL_COMMUNITY)
Admission: AD | Admit: 2016-08-02 | Discharge: 2016-08-02 | Disposition: A | Discharge status: Home / Self Care | Payer: BLUE CROSS/BLUE SHIELD | Source: Ambulatory Visit | Attending: Obstetrics and Gynecology | Admitting: Obstetrics and Gynecology

## 2016-08-02 DIAGNOSIS — O418X10 Other specified disorders of amniotic fluid and membranes, first trimester, not applicable or unspecified: Secondary | ICD-10-CM

## 2016-08-02 DIAGNOSIS — Z87442 Personal history of urinary calculi: Secondary | ICD-10-CM | POA: Insufficient documentation

## 2016-08-02 DIAGNOSIS — O209 Hemorrhage in early pregnancy, unspecified: Secondary | ICD-10-CM | POA: Diagnosis not present

## 2016-08-02 DIAGNOSIS — O208 Other hemorrhage in early pregnancy: Secondary | ICD-10-CM | POA: Diagnosis not present

## 2016-08-02 DIAGNOSIS — Z3A08 8 weeks gestation of pregnancy: Secondary | ICD-10-CM | POA: Diagnosis present

## 2016-08-02 DIAGNOSIS — O468X1 Other antepartum hemorrhage, first trimester: Secondary | ICD-10-CM

## 2016-08-02 LAB — URINE MICROSCOPIC-ADD ON
Bacteria, UA: NONE SEEN
WBC, UA: NONE SEEN WBC/hpf (ref 0–5)

## 2016-08-02 LAB — HCG, QUANTITATIVE, PREGNANCY: HCG, BETA CHAIN, QUANT, S: 124341 m[IU]/mL — AB (ref <5)

## 2016-08-02 LAB — CBC
HCT: 38.3 % (ref 36.0–46.0)
HEMOGLOBIN: 13.3 g/dL (ref 12.0–15.0)
MCH: 29.6 pg (ref 26.0–34.0)
MCHC: 34.7 g/dL (ref 30.0–36.0)
MCV: 85.3 fL (ref 78.0–100.0)
Platelets: 211 10*3/uL (ref 150–400)
RBC: 4.49 MIL/uL (ref 3.87–5.11)
RDW: 13.2 % (ref 11.5–15.5)
WBC: 12.1 10*3/uL — ABNORMAL HIGH (ref 4.0–10.5)

## 2016-08-02 LAB — WET PREP, GENITAL
Clue Cells Wet Prep HPF POC: NONE SEEN
SPERM: NONE SEEN
TRICH WET PREP: NONE SEEN
Yeast Wet Prep HPF POC: NONE SEEN

## 2016-08-02 LAB — URINALYSIS, ROUTINE W REFLEX MICROSCOPIC
BILIRUBIN URINE: NEGATIVE
Glucose, UA: NEGATIVE mg/dL
KETONES UR: NEGATIVE mg/dL
LEUKOCYTES UA: NEGATIVE
NITRITE: NEGATIVE
Protein, ur: NEGATIVE mg/dL
Specific Gravity, Urine: 1.025 (ref 1.005–1.030)
pH: 6 (ref 5.0–8.0)

## 2016-08-02 LAB — POCT PREGNANCY, URINE: PREG TEST UR: POSITIVE — AB

## 2016-08-02 NOTE — MAU Note (Signed)
Started cramping this morning, noted some bleeding. Off and on today, only when wipes.  Now just some dull pain, but nothing different than she has been having.  Had an [place] in office last Wed, everything was fine.

## 2016-08-02 NOTE — MAU Provider Note (Signed)
History     CSN: [id number]  Arrival date and time: 08/02/16 [id number]   First Provider Initiated Contact with Patient 08/02/16 1902      Chief Complaint  Patient presents with  . vag bleeding in first trimester   Emily Mendez is a 35 y.o. G2P1001 at [redacted]w[redacted]d presenting with first episode of vaginal bleeding which began [place] the [place]. She states it is bright red and similar to a light menstrual period. Last coitus was yesterday. Denies abdominal pain.  IUP confirmed by office ultrasound but she has not had pelvic yet.  A positive   Vaginal Bleeding  The patient's primary symptoms include vaginal bleeding. The patient's pertinent negatives include no genital itching, genital lesions, genital odor, pelvic pain or vaginal discharge. This is a new problem. The current episode started today. The problem occurs constantly. The problem has been unchanged. The patient is experiencing no pain. She is pregnant. Pertinent negatives include no abdominal pain, back pain, dysuria, fever, headaches, nausea, urgency or vomiting. The vaginal bleeding is lighter than menses. She has not been passing clots. She has not been passing tissue. Nothing aggravates the symptoms. She has tried nothing for the symptoms. She is sexually active. No, her partner does not have an STD. Her past medical history is significant for a Cesarean section.   OB History  Gravida Para Term Preterm AB Living  2 1 1     1   SAB TAB Ectopic Multiple Live Births          1    # Outcome Date GA Lbr Len/2nd Weight Sex Delivery Anes PTL Lv  2 Current           1 Term 08/07/11 [redacted]w[redacted]d / 02:27 3.75 kg (8 lb 4.3 oz) M CS-LTranv EPI  LIV       Past Medical History:  Diagnosis Date  . Abnormal Pap smear   . History of kidney stones     Past Surgical History:  Procedure Laterality Date  . CESAREAN SECTION  08/07/2011   Procedure: CESAREAN SECTION;  Surgeon: Marylynn Pearson;  Location: [facility];  Service: Gynecology;   Laterality: N/A;    Family History  Problem Relation Age of Onset  . Colitis Mother   . Hypertension Father   . Nephrolithiasis Paternal Grandfather     Social History  Substance Use Topics  . Smoking status: Never Smoker  . Smokeless tobacco: Never Used  . Alcohol use No    Allergies: No Known Allergies  Prescriptions Prior to Admission  Medication Sig Dispense Refill Last Dose  . prenatal vitamin w/FE, FA (PRENATAL 1 + 1) 27-1 MG TABS Take 1 tablet by mouth daily.         Review of Systems  Constitutional: Negative for fever.  Gastrointestinal: Negative for abdominal pain, nausea and vomiting.  Genitourinary: Positive for vaginal bleeding. Negative for dysuria, pelvic pain, urgency and vaginal discharge.  Musculoskeletal: Negative for back pain.  Neurological: Negative for headaches.  Psychiatric/Behavioral: The patient is nervous/anxious.    Physical Exam   Blood pressure 123/77, pulse 97, temperature 98 F (36.7 C), temperature source Oral, resp. rate 18, height 5\' 5"  (1.651 m), weight 87.9 kg (193 lb 12.8 oz), last menstrual period 05/27/2016, unknown if currently breastfeeding.  Physical Exam  Nursing note and vitals reviewed. Constitutional: She is oriented to person, place, and time. She appears well-developed and well-nourished. No distress.  HENT:  Head: Normocephalic.  Eyes: Pupils are equal, round, and  reactive to light.  Neck: Normal range of motion.  Cardiovascular: Normal rate.   Respiratory: Effort normal.  GI: Soft. There is no tenderness.  Genitourinary: Vagina normal and uterus normal.  Genitourinary Comments: NEFG Spec: small amount BRB swabbed from vault, no active bleeding noted Bimanual: cervix firm, long, closed, uterus NT 8-10 wks size  Musculoskeletal: Normal range of motion.  Neurological: She is alert and oriented to person, place, and time.  Skin: Skin is warm and dry.  Psychiatric: She has a normal mood and affect. Her behavior is  normal. Thought content normal.    MAU Course  Procedures Results for orders placed or performed during the hospital encounter of 08/02/16 (from the past 24 hour(s))  Urinalysis, Routine w reflex microscopic (not at [facility])     Status: Abnormal   Collection Time: 08/02/16  6:00 PM  Result Value Ref Range   Color, Urine YELLOW YELLOW   APPearance CLEAR CLEAR   Specific Gravity, Urine 1.025 1.005 - 1.030   pH 6.0 5.0 - 8.0   Glucose, UA NEGATIVE NEGATIVE mg/dL   Hgb urine dipstick LARGE (A) NEGATIVE   Bilirubin Urine NEGATIVE NEGATIVE   Ketones, ur NEGATIVE NEGATIVE mg/dL   Protein, ur NEGATIVE NEGATIVE mg/dL   Nitrite NEGATIVE NEGATIVE   Leukocytes, UA NEGATIVE NEGATIVE  Urine microscopic-add on     Status: Abnormal   Collection Time: 08/02/16  6:00 PM  Result Value Ref Range   Squamous Epithelial / LPF 6-30 (A) NONE SEEN   WBC, UA NONE SEEN 0 - 5 WBC/hpf   RBC / HPF 6-30 0 - 5 RBC/hpf   Bacteria, UA NONE SEEN NONE SEEN   Crystals CA OXALATE CRYSTALS (A) NEGATIVE   Urine-Other MUCOUS PRESENT   Pregnancy, urine POC     Status: Abnormal   Collection Time: 08/02/16  6:06 PM  Result Value Ref Range   Preg Test, Ur POSITIVE (A) NEGATIVE  GC/CT sent US Ob Comp Less 14 Wks  Result Date: 08/02/2016 CLINICAL DATA:  Intermittent spotting since 11 a.m. Quantitative beta HCG is pending. LMP was06/28/2017. Gestational age by LMP is9 weeks 4 days. EDC by LMP is04/02/2017. EXAM: OBSTETRIC <14 WK [place] AND TRANSVAGINAL OB US TECHNIQUE: Both transabdominal and transvaginal ultrasound examinations were performed for complete evaluation of the gestation as well as the maternal uterus, adnexal regions, and pelvic cul-de-sac. Transvaginal technique was performed to assess early pregnancy. COMPARISON:  None applicable FINDINGS: Intrauterine gestational sac: Present Yolk sac:  Present Embryo:  Present Cardiac Activity: Present Heart Rate: 189  bpm CRL:  23 mm  mm   8 w   6 d                  [place] EDC: 03/08/2017  Subchorionic hemorrhage:  Large subchorionic hemorrhage is present. Maternal uterus/adnexae: Ovaries have a normal appearance. No free pelvic fluid. IMPRESSION: 1. Single living intrauterine embryo measuring 8 weeks 6 days. 2. By today's exam EDC is 03/08/2017. 3. Large subchorionic hemorrhage noted. Electronically Signed   By: Nolon Nations M.D.   On: 08/02/2016 20:18   US Ob Transvaginal  Result Date: 08/02/2016 CLINICAL DATA:  Intermittent spotting since 11 a.m. Quantitative beta HCG is pending. LMP was06/28/2017. Gestational age by LMP is9 weeks 4 days. EDC by LMP is04/02/2017. EXAM: OBSTETRIC <14 WK [place] AND TRANSVAGINAL OB US TECHNIQUE: Both transabdominal and transvaginal ultrasound examinations were performed for complete evaluation of the gestation as well as the maternal uterus, adnexal regions, and pelvic cul-de-sac. Transvaginal  technique was performed to assess early pregnancy. COMPARISON:  None applicable FINDINGS: Intrauterine gestational sac: Present Yolk sac:  Present Embryo:  Present Cardiac Activity: Present Heart Rate: 189  bpm CRL:  23 mm  mm   8 w   6 d                  [place] EDC: 03/08/2017 Subchorionic hemorrhage:  Large subchorionic hemorrhage is present. Maternal uterus/adnexae: Ovaries have a normal appearance. No free pelvic fluid. IMPRESSION: 1. Single living intrauterine embryo measuring 8 weeks 6 days. 2. By today's exam EDC is 03/08/2017. 3. Large subchorionic hemorrhage noted. Electronically Signed   By: Nolon Nations M.D.   On: 08/02/2016 20:18   D/W Dr. Royston Sinner  Assessment and Plan   1. Subchorionic hemorrhage in first trimester   2. Bleeding in early pregnancy   G2P1001 at [redacted]w[redacted]d  Viable IUP   Discharge home with reassurance and pelvic rest.   Medication List    TAKE these medications   prenatal vitamin w/FE, FA 27-1 MG Tabs tablet Take 1 tablet by mouth daily.      Follow-up Information    Tyson Dense, MD Follow up on 08/10/2016.   Specialty:   Obstetrics and Gynecology Why:  Keep your scheduled prenatal appointment Contact information: [address] [place] [address] [phone]           Aqua Denslow 08/02/2016, 7:07 PM

## 2016-08-02 NOTE — Discharge Instructions (Signed)
Subchorionic Hematoma A subchorionic hematoma is a gathering of blood between the outer wall of the placenta and the inner wall of the womb (uterus). The placenta is the organ that connects the fetus to the wall of the uterus. The placenta performs the feeding, breathing (oxygen to the fetus), and waste removal (excretory work) of the fetus.  Subchorionic hematoma is the most common abnormality found on a result from ultrasonography done during the first trimester or early second trimester of pregnancy. If there has been little or no vaginal bleeding, early small hematomas usually shrink on their own and do not affect your baby or pregnancy. The blood is gradually absorbed over 1-2 weeks. When bleeding starts later in pregnancy or the hematoma is larger or occurs in an older pregnant woman, the outcome may not be as good. Larger hematomas may get bigger, which increases the chances for miscarriage. Subchorionic hematoma also increases the risk of premature detachment of the placenta from the uterus, preterm (premature) labor, and stillbirth. HOME CARE INSTRUCTIONS  Stay on bed rest if your health care provider recommends this. Although bed rest will not prevent more bleeding or prevent a miscarriage, your health care provider may recommend bed rest until you are advised otherwise.  Avoid heavy lifting (more than 10 lb [4.5 kg]), exercise, sexual intercourse, or douching as directed by your health care provider.  Keep track of the number of pads you use each day and how soaked (saturated) they are. Write down this information.  Do not use tampons.  Keep all follow-up appointments as directed by your health care provider. Your health care provider may ask you to have follow-up blood tests or ultrasound tests or both. SEEK IMMEDIATE MEDICAL CARE IF:  You have severe cramps in your stomach, back, abdomen, or pelvis.  You have a fever.  You pass large clots or tissue. Save any tissue for your  health care provider to look at.  Your bleeding increases or you become lightheaded, feel weak, or have fainting episodes.   This information is not intended to replace advice given to you by your health care provider. Make sure you discuss any questions you have with your health care provider.   Document Released: 03/03/2007 Document Revised: 12/07/2014 Document Reviewed: 06/15/2013 Elsevier Interactive Patient Education 2016 [place] Hematoma A subchorionic hematoma is a gathering of blood between the outer wall of the placenta and the inner wall of the womb (uterus). The placenta is the organ that connects the fetus to the wall of the uterus. The placenta performs the feeding, breathing (oxygen to the fetus), and waste removal (excretory work) of the fetus.  Subchorionic hematoma is the most common abnormality found on a result from ultrasonography done during the first trimester or early second trimester of pregnancy. If there has been little or no vaginal bleeding, early small hematomas usually shrink on their own and do not affect your baby or pregnancy. The blood is gradually absorbed over 1-2 weeks. When bleeding starts later in pregnancy or the hematoma is larger or occurs in an older pregnant woman, the outcome may not be as good. Larger hematomas may get bigger, which increases the chances for miscarriage. Subchorionic hematoma also increases the risk of premature detachment of the placenta from the uterus, preterm (premature) labor, and stillbirth. HOME CARE INSTRUCTIONS  Stay on bed rest if your health care provider recommends this. Although bed rest will not prevent more bleeding or prevent a miscarriage, your health care provider  may recommend bed rest until you are advised otherwise.  Avoid heavy lifting (more than 10 lb [4.5 kg]), exercise, sexual intercourse, or douching as directed by your health care provider.  Keep track of the number of pads you use each  day and how soaked (saturated) they are. Write down this information.  Do not use tampons.  Keep all follow-up appointments as directed by your health care provider. Your health care provider may ask you to have follow-up blood tests or ultrasound tests or both. SEEK IMMEDIATE MEDICAL CARE IF:  You have severe cramps in your stomach, back, abdomen, or pelvis.  You have a fever.  You pass large clots or tissue. Save any tissue for your health care provider to look at.  Your bleeding increases or you become lightheaded, feel weak, or have fainting episodes.   This information is not intended to replace advice given to you by your health care provider. Make sure you discuss any questions you have with your health care provider.   Document Released: 03/03/2007 Document Revised: 12/07/2014 Document Reviewed: 06/15/2013 Elsevier Interactive Patient Education 2016 Elsevier Inc[employer] Vaginal Bleeding During Pregnancy, First Trimester A small amount of bleeding (spotting) from the vagina is relatively common in early pregnancy. It usually stops on its own. Various things may cause bleeding or spotting in early pregnancy. Some bleeding may be related to the pregnancy, and some may not. In most cases, the bleeding is normal and is not a problem. However, bleeding can also be a sign of something serious. Be sure to tell your health care provider about any vaginal bleeding right away. Some possible causes of vaginal bleeding during the first trimester include:  Infection or inflammation of the cervix.  Growths (polyps) on the cervix.  Miscarriage or threatened miscarriage.  Pregnancy tissue has developed outside of the uterus and in a fallopian tube (tubal pregnancy).  Tiny cysts have developed in the uterus instead of pregnancy tissue (molar pregnancy). HOME CARE INSTRUCTIONS  Watch your condition for any changes. The following actions may help to lessen any discomfort you are feeling:  Follow  your health care provider's instructions for limiting your activity. If your health care provider orders bed rest, you may need to stay in bed and only get up to use the bathroom. However, your health care provider may allow you to continue light activity.  If needed, make plans for someone to help with your regular activities and responsibilities while you are on bed rest.  Keep track of the number of pads you use each day, how often you change pads, and how soaked (saturated) they are. Write this down.  Do not use tampons. Do not douche.  Do not have sexual intercourse or orgasms until approved by your health care provider.  If you pass any tissue from your vagina, save the tissue so you can show it to your health care provider.  Only take over-the-counter or prescription medicines as directed by your health care provider.  Do not take aspirin because it can make you bleed.  Keep all follow-up appointments as directed by your health care provider. SEEK MEDICAL CARE IF:  You have any vaginal bleeding during any part of your pregnancy.  You have cramps or labor pains.  You have a fever, not controlled by medicine. SEEK IMMEDIATE MEDICAL CARE IF:   You have severe cramps in your back or belly (abdomen).  You pass large clots or tissue from your vagina.  Your bleeding increases.  You feel light-headed  or weak, or you have fainting episodes.  You have chills.  You are leaking fluid or have a gush of fluid from your vagina.  You pass out while having a bowel movement. MAKE SURE YOU:  Understand these instructions.  Will watch your condition.  Will get help right away if you are not doing well or get worse.   This information is not intended to replace advice given to you by your health care provider. Make sure you discuss any questions you have with your health care provider.   Document Released: 08/26/2005 Document Revised: 11/21/2013 Document Reviewed:  07/24/2013 Elsevier Interactive Patient Education [employer].

## 2016-08-03 LAB — RPR: RPR Ser Ql: NONREACTIVE

## 2016-08-04 LAB — GC/CHLAMYDIA PROBE AMP (~~LOC~~) NOT AT ARMC
CHLAMYDIA, DNA PROBE: NEGATIVE
Neisseria Gonorrhea: NEGATIVE

## 2017-02-08 NOTE — H&P (Addendum)
Emily Mendez is a 36 y.o. female presenting for repeat c-section and sterilization.  Pregnancy uncomplicated.  OB History    Gravida Para Term Preterm AB Living   2 1 1     1    SAB TAB Ectopic Multiple Live Births           1     Past Medical History:  Diagnosis Date  . Abnormal Pap smear   . History of kidney stones    Past Surgical History:  Procedure Laterality Date  . CESAREAN SECTION  08/07/2011   Procedure: CESAREAN SECTION;  Surgeon: Marylynn Pearson;  Location: [facility];  Service: Gynecology;  Laterality: N/A;   Family History: family history includes Colitis in her mother; Hypertension in her father; Nephrolithiasis in her paternal grandfather. Social History:  reports that she has never smoked. She has never used smokeless tobacco. She reports that she does not drink alcohol or use drugs.     Maternal Diabetes: No Genetic Screening: Normal Maternal Ultrasounds/Referrals: Normal Fetal Ultrasounds or other Referrals:  None Maternal Substance Abuse:  No Significant Maternal Medications:  None Significant Maternal Lab Results:  None Other Comments:  None  ROS History   Last menstrual period 05/27/2016, unknown if currently breastfeeding. Exam Physical Exam  Prenatal labs: ABO, Rh:  A+ Antibody:  neg Rubella:  immune RPR: Non Reactive (09/03 1917)  HBsAg:   neg HIV:   neg GBS:   neg  Assessment/Plan: Admit Repeat c-section with BPS  r/b/a discussed, questions answered, informed consent  Ginia Rudell 02/08/2017, 1:32 PM

## 2017-02-18 ENCOUNTER — Telehealth (HOSPITAL_COMMUNITY): Payer: Self-pay | Admitting: *Deleted

## 2017-02-18 ENCOUNTER — Encounter (HOSPITAL_COMMUNITY): Payer: Self-pay

## 2017-02-18 LAB — OB RESULTS CONSOLE GBS: STREP GROUP B AG: NEGATIVE

## 2017-02-18 NOTE — Telephone Encounter (Signed)
Preadmission screen  

## 2017-02-19 ENCOUNTER — Encounter (HOSPITAL_COMMUNITY): Payer: Self-pay

## 2017-02-25 ENCOUNTER — Encounter (HOSPITAL_COMMUNITY)
Admission: RE | Admit: 2017-02-25 | Discharge: 2017-02-25 | Disposition: A | Discharge status: Home / Self Care | Payer: BLUE CROSS/BLUE SHIELD | Source: Ambulatory Visit | Attending: Obstetrics and Gynecology | Admitting: Obstetrics and Gynecology

## 2017-02-25 HISTORY — DX: Personal history of other infectious and parasitic diseases: Z86.19

## 2017-02-25 HISTORY — DX: Unspecified abnormal cytological findings in specimens from vagina: R87.629

## 2017-02-25 LAB — CBC
HCT: 36.3 % (ref 36.0–46.0)
HEMOGLOBIN: 12.5 g/dL (ref 12.0–15.0)
MCH: 31 pg (ref 26.0–34.0)
MCHC: 34.4 g/dL (ref 30.0–36.0)
MCV: 90.1 fL (ref 78.0–100.0)
Platelets: 125 10*3/uL — ABNORMAL LOW (ref 150–400)
RBC: 4.03 MIL/uL (ref 3.87–5.11)
RDW: 13.9 % (ref 11.5–15.5)
WBC: 9 10*3/uL (ref 4.0–10.5)

## 2017-02-25 LAB — TYPE AND SCREEN
ABO/RH(D): A POS
Antibody Screen: NEGATIVE

## 2017-02-25 LAB — ABO/RH: ABO/RH(D): A POS

## 2017-02-25 NOTE — Patient Instructions (Signed)
[place]  02/25/2017   Your procedure is scheduled on:  02/26/2017  Enter through the Main Entrance of [facility] at [place] up the phone at the desk and dial [phone] .   Call this number if you have problems the morning of surgery: [phone]   Remember:   Do not eat food:After Midnight.  Do not drink clear liquids: After Midnight.  Take these medicines the morning of surgery with A SIP OF WATER: none   Do not wear jewelry, make-up or nail polish.  Do not wear lotions, powders, or perfumes. Do not wear deodorant.  Do not shave 48 hours prior to surgery.  Do not bring valuables to the hospital.  [facility] is not   responsible for any belongings or valuables brought to the hospital.  Contacts, dentures or bridgework may not be worn into surgery.  Leave suitcase in the car. After surgery it may be brought to your room.  For patients admitted to the hospital, checkout time is 11:00 AM the day of              discharge.   Patients discharged the day of surgery will not be allowed to drive             home.  Name and phone number of your driver: na  Special Instructions:   N/A   Please read over the following fact sheets that you were given:   Surgical Site Infection Prevention

## 2017-02-26 ENCOUNTER — Inpatient Hospital Stay (HOSPITAL_COMMUNITY): Payer: BLUE CROSS/BLUE SHIELD | Admitting: Anesthesiology

## 2017-02-26 ENCOUNTER — Encounter (HOSPITAL_COMMUNITY)
Admission: RE | Disposition: A | Discharge status: Home / Self Care | Payer: Self-pay | Source: Ambulatory Visit | Attending: Obstetrics and Gynecology

## 2017-02-26 ENCOUNTER — Inpatient Hospital Stay (HOSPITAL_COMMUNITY)
Admission: RE | Admit: 2017-02-26 | Discharge: 2017-02-28 | DRG: 766 | Disposition: A | Discharge status: Home / Self Care | Payer: BLUE CROSS/BLUE SHIELD | Source: Ambulatory Visit | Attending: Obstetrics and Gynecology | Admitting: Obstetrics and Gynecology

## 2017-02-26 ENCOUNTER — Encounter (HOSPITAL_COMMUNITY): Payer: Self-pay | Admitting: Anesthesiology

## 2017-02-26 DIAGNOSIS — Z3A39 39 weeks gestation of pregnancy: Secondary | ICD-10-CM

## 2017-02-26 DIAGNOSIS — Z6832 Body mass index (BMI) 32.0-32.9, adult: Secondary | ICD-10-CM | POA: Diagnosis not present

## 2017-02-26 DIAGNOSIS — O9962 Diseases of the digestive system complicating childbirth: Secondary | ICD-10-CM | POA: Diagnosis present

## 2017-02-26 DIAGNOSIS — O34211 Maternal care for low transverse scar from previous cesarean delivery: Secondary | ICD-10-CM | POA: Diagnosis present

## 2017-02-26 DIAGNOSIS — Z302 Encounter for sterilization: Secondary | ICD-10-CM

## 2017-02-26 DIAGNOSIS — O99214 Obesity complicating childbirth: Secondary | ICD-10-CM | POA: Diagnosis present

## 2017-02-26 DIAGNOSIS — K219 Gastro-esophageal reflux disease without esophagitis: Secondary | ICD-10-CM | POA: Diagnosis present

## 2017-02-26 DIAGNOSIS — Z8249 Family history of ischemic heart disease and other diseases of the circulatory system: Secondary | ICD-10-CM

## 2017-02-26 DIAGNOSIS — E669 Obesity, unspecified: Secondary | ICD-10-CM | POA: Diagnosis present

## 2017-02-26 DIAGNOSIS — Z98891 History of uterine scar from previous surgery: Secondary | ICD-10-CM

## 2017-02-26 LAB — RPR: RPR Ser Ql: NONREACTIVE

## 2017-02-26 SURGERY — Surgical Case
Anesthesia: Spinal | Laterality: Bilateral | Wound class: Clean Contaminated

## 2017-02-26 MED ORDER — SIMETHICONE 80 MG PO CHEW: 80.0000 mg | CHEWABLE_TABLET | ORAL | Status: DC | PRN

## 2017-02-26 MED ORDER — LACTATED RINGERS IV SOLN: INTRAVENOUS | Status: DC

## 2017-02-26 MED ORDER — SCOPOLAMINE 1 MG/3DAYS TD PT72
1.0000 | MEDICATED_PATCH | Freq: Once | TRANSDERMAL | Status: DC
  Administered 2017-02-26: 1.5 mg via TRANSDERMAL
  Filled 2017-02-26: qty 1

## 2017-02-26 MED ORDER — PHENYLEPHRINE 8 MG IN D5W 100 ML (0.08MG/ML) PREMIX OPTIME
INJECTION | INTRAVENOUS | Status: AC
  Filled 2017-02-26: qty 100

## 2017-02-26 MED ORDER — DIPHENHYDRAMINE HCL 25 MG PO CAPS: 25.0000 mg | ORAL_CAPSULE | ORAL | Status: DC | PRN

## 2017-02-26 MED ORDER — OXYCODONE-ACETAMINOPHEN 5-325 MG PO TABS
1.0000 | ORAL_TABLET | ORAL | Status: DC | PRN
Start: 2017-02-26 — End: 2017-02-28
  Administered 2017-02-27 – 2017-02-28 (×5): 1 via ORAL
  Filled 2017-02-26 (×5): qty 1

## 2017-02-26 MED ORDER — DIBUCAINE 1 % RE OINT: 1.0000 "application " | TOPICAL_OINTMENT | RECTAL | Status: DC | PRN

## 2017-02-26 MED ORDER — SIMETHICONE 80 MG PO CHEW
80.0000 mg | CHEWABLE_TABLET | Freq: Three times a day (TID) | ORAL | Status: DC
  Administered 2017-02-26 – 2017-02-28 (×6): 80 mg via ORAL
  Filled 2017-02-26 (×6): qty 1

## 2017-02-26 MED ORDER — MORPHINE SULFATE (PF) 0.5 MG/ML IJ SOLN
INTRAMUSCULAR | Status: DC | PRN
  Administered 2017-02-26: .2 mg via INTRATHECAL

## 2017-02-26 MED ORDER — OXYTOCIN 40 UNITS IN LACTATED RINGERS INFUSION - SIMPLE MED: 2.5000 [IU]/h | INTRAVENOUS | Status: AC

## 2017-02-26 MED ORDER — TETANUS-DIPHTH-ACELL PERTUSSIS 5-2.5-18.5 LF-MCG/0.5 IM SUSP: 0.5000 mL | Freq: Once | INTRAMUSCULAR | Status: DC

## 2017-02-26 MED ORDER — SENNOSIDES-DOCUSATE SODIUM 8.6-50 MG PO TABS
2.0000 | ORAL_TABLET | ORAL | Status: DC
  Administered 2017-02-27 (×2): 2 via ORAL
  Filled 2017-02-26 (×2): qty 2

## 2017-02-26 MED ORDER — NALBUPHINE HCL 10 MG/ML IJ SOLN: 5.0000 mg | INTRAMUSCULAR | Status: DC | PRN

## 2017-02-26 MED ORDER — ONDANSETRON HCL 4 MG/2ML IJ SOLN
INTRAMUSCULAR | Status: AC
  Filled 2017-02-26: qty 2

## 2017-02-26 MED ORDER — SOD CITRATE-CITRIC ACID 500-334 MG/5ML PO SOLN
ORAL | Status: AC
  Administered 2017-02-26: 30 mL
  Filled 2017-02-26: qty 15

## 2017-02-26 MED ORDER — FENTANYL CITRATE (PF) 100 MCG/2ML IJ SOLN
INTRAMUSCULAR | Status: AC
  Administered 2017-02-26: 50 ug via INTRAVENOUS
  Filled 2017-02-26: qty 2

## 2017-02-26 MED ORDER — DIPHENHYDRAMINE HCL 50 MG/ML IJ SOLN: 12.5000 mg | INTRAMUSCULAR | Status: DC | PRN

## 2017-02-26 MED ORDER — SODIUM CHLORIDE 0.9% FLUSH: 3.0000 mL | INTRAVENOUS | Status: DC | PRN

## 2017-02-26 MED ORDER — DEXTROSE 5 % IV SOLN
1.0000 ug/kg/h | INTRAVENOUS | Status: DC | PRN
  Filled 2017-02-26: qty 2

## 2017-02-26 MED ORDER — PHENYLEPHRINE 8 MG IN D5W 100 ML (0.08MG/ML) PREMIX OPTIME
INJECTION | INTRAVENOUS | Status: DC | PRN
  Administered 2017-02-26: 40 ug/min via INTRAVENOUS

## 2017-02-26 MED ORDER — SODIUM CHLORIDE 0.9 % IJ SOLN
INTRAMUSCULAR | Status: AC
  Filled 2017-02-26: qty 40

## 2017-02-26 MED ORDER — LACTATED RINGERS IV SOLN
INTRAVENOUS | Status: DC | PRN
  Administered 2017-02-26: 08:00:00 via INTRAVENOUS

## 2017-02-26 MED ORDER — FENTANYL CITRATE (PF) 100 MCG/2ML IJ SOLN
INTRAMUSCULAR | Status: AC
  Filled 2017-02-26: qty 2

## 2017-02-26 MED ORDER — KETOROLAC TROMETHAMINE 30 MG/ML IJ SOLN
30.0000 mg | Freq: Four times a day (QID) | INTRAMUSCULAR | Status: AC | PRN
  Administered 2017-02-26: 30 mg via INTRAVENOUS
  Filled 2017-02-26: qty 1

## 2017-02-26 MED ORDER — SOD CITRATE-CITRIC ACID 500-334 MG/5ML PO SOLN: 30.0000 mL | Freq: Once | ORAL | Status: DC

## 2017-02-26 MED ORDER — MEDROXYPROGESTERONE ACETATE 150 MG/ML IM SUSP: 150.0000 mg | INTRAMUSCULAR | Status: DC | PRN

## 2017-02-26 MED ORDER — CEFAZOLIN SODIUM-DEXTROSE 2-4 GM/100ML-% IV SOLN
2.0000 g | INTRAVENOUS | Status: AC
  Administered 2017-02-26: 2 g via INTRAVENOUS

## 2017-02-26 MED ORDER — MEPERIDINE HCL 25 MG/ML IJ SOLN: 6.2500 mg | INTRAMUSCULAR | Status: DC | PRN

## 2017-02-26 MED ORDER — PRENATAL MULTIVITAMIN CH
1.0000 | ORAL_TABLET | Freq: Every day | ORAL | Status: DC
  Administered 2017-02-26 – 2017-02-27 (×2): 1 via ORAL
  Filled 2017-02-26 (×3): qty 1

## 2017-02-26 MED ORDER — COCONUT OIL OIL: 1.0000 "application " | TOPICAL_OIL | Status: DC | PRN

## 2017-02-26 MED ORDER — OXYTOCIN 10 UNIT/ML IJ SOLN
INTRAMUSCULAR | Status: AC
  Filled 2017-02-26: qty 4

## 2017-02-26 MED ORDER — ACETAMINOPHEN 325 MG PO TABS: 650.0000 mg | ORAL_TABLET | ORAL | Status: DC | PRN

## 2017-02-26 MED ORDER — KETOROLAC TROMETHAMINE 30 MG/ML IJ SOLN
30.0000 mg | Freq: Four times a day (QID) | INTRAMUSCULAR | Status: AC | PRN
Start: 2017-02-26 — End: 2017-02-27

## 2017-02-26 MED ORDER — NALOXONE HCL 0.4 MG/ML IJ SOLN: 0.4000 mg | INTRAMUSCULAR | Status: DC | PRN

## 2017-02-26 MED ORDER — OXYCODONE-ACETAMINOPHEN 5-325 MG PO TABS
2.0000 | ORAL_TABLET | ORAL | Status: DC | PRN
Start: 2017-02-26 — End: 2017-02-28

## 2017-02-26 MED ORDER — FENTANYL CITRATE (PF) 100 MCG/2ML IJ SOLN: 25.0000 ug | INTRAMUSCULAR | Status: DC | PRN

## 2017-02-26 MED ORDER — MEPERIDINE HCL 25 MG/ML IJ SOLN
INTRAMUSCULAR | Status: DC | PRN
  Administered 2017-02-26: 12.5 mg via INTRAVENOUS

## 2017-02-26 MED ORDER — MEASLES, MUMPS & RUBELLA VAC ~~LOC~~ INJ
0.5000 mL | INJECTION | Freq: Once | SUBCUTANEOUS | Status: DC
  Filled 2017-02-26: qty 0.5

## 2017-02-26 MED ORDER — MEPERIDINE HCL 25 MG/ML IJ SOLN
INTRAMUSCULAR | Status: AC
  Filled 2017-02-26: qty 1

## 2017-02-26 MED ORDER — ONDANSETRON HCL 4 MG/2ML IJ SOLN
INTRAMUSCULAR | Status: DC | PRN
  Administered 2017-02-26: 4 mg via INTRAVENOUS

## 2017-02-26 MED ORDER — NALBUPHINE HCL 10 MG/ML IJ SOLN: 5.0000 mg | Freq: Once | INTRAMUSCULAR | Status: DC | PRN

## 2017-02-26 MED ORDER — MENTHOL 3 MG MT LOZG: 1.0000 | LOZENGE | OROMUCOSAL | Status: DC | PRN

## 2017-02-26 MED ORDER — DIPHENHYDRAMINE HCL 25 MG PO CAPS: 25.0000 mg | ORAL_CAPSULE | Freq: Four times a day (QID) | ORAL | Status: DC | PRN

## 2017-02-26 MED ORDER — FENTANYL CITRATE (PF) 100 MCG/2ML IJ SOLN
INTRAMUSCULAR | Status: DC | PRN
  Administered 2017-02-26: 20 ug via INTRATHECAL

## 2017-02-26 MED ORDER — BUPIVACAINE IN DEXTROSE 0.75-8.25 % IT SOLN
INTRATHECAL | Status: AC
  Filled 2017-02-26: qty 2

## 2017-02-26 MED ORDER — SODIUM CHLORIDE 0.9 % IR SOLN
Status: DC | PRN
  Administered 2017-02-26: 1000 mL

## 2017-02-26 MED ORDER — SIMETHICONE 80 MG PO CHEW
80.0000 mg | CHEWABLE_TABLET | ORAL | Status: DC
  Administered 2017-02-27 (×2): 80 mg via ORAL
  Filled 2017-02-26 (×2): qty 1

## 2017-02-26 MED ORDER — MORPHINE SULFATE (PF) 0.5 MG/ML IJ SOLN
INTRAMUSCULAR | Status: AC
  Filled 2017-02-26: qty 10

## 2017-02-26 MED ORDER — PHENYLEPHRINE HCL 10 MG/ML IJ SOLN
INTRAMUSCULAR | Status: DC | PRN
  Administered 2017-02-26: 40 ug via INTRAVENOUS

## 2017-02-26 MED ORDER — ONDANSETRON HCL 4 MG/2ML IJ SOLN: 4.0000 mg | Freq: Three times a day (TID) | INTRAMUSCULAR | Status: DC | PRN

## 2017-02-26 MED ORDER — ACETAMINOPHEN 500 MG PO TABS
1000.0000 mg | ORAL_TABLET | Freq: Four times a day (QID) | ORAL | Status: AC
  Administered 2017-02-26 – 2017-02-27 (×2): 1000 mg via ORAL
  Filled 2017-02-26 (×2): qty 2

## 2017-02-26 MED ORDER — IBUPROFEN 600 MG PO TABS
600.0000 mg | ORAL_TABLET | Freq: Four times a day (QID) | ORAL | Status: DC
  Administered 2017-02-26 – 2017-02-28 (×7): 600 mg via ORAL
  Filled 2017-02-26 (×8): qty 1

## 2017-02-26 MED ORDER — IBUPROFEN 600 MG PO TABS: 600.0000 mg | ORAL_TABLET | Freq: Four times a day (QID) | ORAL | Status: DC | PRN

## 2017-02-26 MED ORDER — WITCH HAZEL-GLYCERIN EX PADS: 1.0000 "application " | MEDICATED_PAD | CUTANEOUS | Status: DC | PRN

## 2017-02-26 MED ORDER — BUPIVACAINE IN DEXTROSE 0.75-8.25 % IT SOLN
INTRATHECAL | Status: DC | PRN
  Administered 2017-02-26: 1.8 mL via INTRATHECAL

## 2017-02-26 MED ORDER — OXYTOCIN 10 UNIT/ML IJ SOLN
INTRAVENOUS | Status: DC | PRN
  Administered 2017-02-26: 40 [IU] via INTRAVENOUS

## 2017-02-26 MED ORDER — DEXTROSE IN LACTATED RINGERS 5 % IV SOLN
INTRAVENOUS | Status: DC
  Administered 2017-02-26 – 2017-02-27 (×2): via INTRAVENOUS

## 2017-02-26 SURGICAL SUPPLY — 24 items
CHLORAPREP W/TINT 26ML (MISCELLANEOUS) ×3 IMPLANT
CLAMP CORD UMBIL (MISCELLANEOUS) ×3 IMPLANT
CLOTH BEACON ORANGE TIMEOUT ST (SAFETY) ×3 IMPLANT
DERMABOND ADVANCED (GAUZE/BANDAGES/DRESSINGS) ×2
DERMABOND ADVANCED .7 DNX12 (GAUZE/BANDAGES/DRESSINGS) ×1 IMPLANT
DRSG OPSITE POSTOP 4X10 (GAUZE/BANDAGES/DRESSINGS) ×3 IMPLANT
ELECT REM PT RETURN 9FT ADLT (ELECTROSURGICAL) ×3
ELECTRODE REM PT RTRN 9FT ADLT (ELECTROSURGICAL) ×1 IMPLANT
GLOVE BIO SURGEON STRL SZ 6.5 (GLOVE) ×2 IMPLANT
GLOVE BIO SURGEONS STRL SZ 6.5 (GLOVE) ×1
GLOVE BIOGEL PI IND STRL 7.0 (GLOVE) ×2 IMPLANT
GLOVE BIOGEL PI INDICATOR 7.0 (GLOVE) ×4
GOWN STRL REUS W/TWL LRG LVL3 (GOWN DISPOSABLE) ×6 IMPLANT
NS IRRIG 1000ML POUR BTL (IV SOLUTION) ×3 IMPLANT
PACK C SECTION WH (CUSTOM PROCEDURE TRAY) ×3 IMPLANT
PAD OB MATERNITY 4.3X12.25 (PERSONAL CARE ITEMS) ×3 IMPLANT
PENCIL SMOKE EVAC W/HOLSTER (ELECTROSURGICAL) ×3 IMPLANT
SUT CHROMIC 0 CTX 36 (SUTURE) ×9 IMPLANT
SUT MON AB-0 CT1 36 (SUTURE) ×3 IMPLANT
SUT PDS AB 0 CTX 60 (SUTURE) ×3 IMPLANT
SUT PLAIN 0 NONE (SUTURE) ×3 IMPLANT
SUT VIC AB 4-0 KS 27 (SUTURE) ×3 IMPLANT
TOWEL OR 17X24 6PK STRL BLUE (TOWEL DISPOSABLE) ×3 IMPLANT
TRAY FOLEY BAG SILVER LF 14FR (SET/KITS/TRAYS/PACK) ×3 IMPLANT

## 2017-02-26 NOTE — Plan of Care (Signed)
Problem: Education: Goal: Knowledge of [facility] Education information/materials will improve Outcome: Completed/Met Date Met: 02/26/17 Admission paperwork reviewed with patient and significant other. Patient verbalizes understanding of information.   Problem: Activity: Goal: Will verbalize the importance of balancing activity with adequate rest periods Outcome: Completed/Met Date Met: 02/26/17 Ambulated patient to bathroom; patient did well. Encouraged more movement in room and tomorrow ambulating in hallway three times a day.

## 2017-02-26 NOTE — Transfer of Care (Signed)
Immediate Anesthesia Transfer of Care Note  Patient: Emily Mendez  Procedure(s) Performed: Procedure(s) with comments: CESAREAN SECTION WITH BILATERAL TUBAL LIGATION (Bilateral) - Need RNFA  Patient Location: PACU  Anesthesia Type:Spinal  Level of Consciousness: awake, alert  and oriented  Airway & Oxygen Therapy: Patient Spontanous Breathing  Post-op Assessment: Report given to RN and Post -op Vital signs reviewed and stable  Post vital signs: Reviewed and stable  Last Vitals:  Vitals:   02/26/17 0625  BP: 129/82  Pulse: 78  Resp: 16  Temp: 37.3 C    Last Pain:  Vitals:   02/26/17 0707  TempSrc:   PainSc: 0-No pain         Complications: No apparent anesthesia complications

## 2017-02-26 NOTE — Anesthesia Procedure Notes (Signed)
Spinal  Patient location during procedure: OR Start time: 02/26/2017 7:40 AM Staffing Anesthesiologist: Josephine Igo Performed: anesthesiologist  Preanesthetic Checklist Completed: patient identified, site marked, surgical consent, pre-op evaluation, timeout performed, IV checked, risks and benefits discussed and monitors and equipment checked Spinal Block Patient position: sitting Prep: site prepped and draped and DuraPrep Patient monitoring: heart rate, cardiac monitor, continuous pulse ox and blood pressure Approach: midline Location: L4-5 Injection technique: single-shot Needle Needle type: [place]  Needle gauge: 25 G Needle length: 9 cm Needle insertion depth: 6 cm Assessment Sensory level: T4 Additional Notes Patient tolerated procedure well. Adequate sensory level.

## 2017-02-26 NOTE — Lactation Note (Signed)
This note was copied from a baby's chart. Lactation Consultation Note  Patient Name: Emily Mendez Date: 02/26/2017 Reason for consult: Initial assessment  Baby 52 hours old. Mom hold baby just after bath. Nurse Tech who performed the bath states that the baby had a large spit up during the bath. Mom reports that the baby has not been to breast since this morning and she is going to attempt to latch in a while, the give formula if the baby will not latch. Discussed with mom that baby probably felt full form fluid that he just spit up, and offered to assist with latching. Mom declined, so enc her to call out for assistance if she changed her mind. Mom reported that her first child had a lot of trouble with nursing and she gave up after 2 weeks--and will not go that long with this baby. Discussed the progression of milk coming to volume and the benefits of STS. Mom given [facility] brochure, aware of OP/BFSG and [place] phone line assistance after D/C.   Maternal Data Does the patient have breastfeeding experience prior to this delivery?: Yes  Feeding    LATCH Score/Interventions                      Lactation Tools Discussed/Used     Consult Status Consult Status: Follow-up Date: 02/27/17 Follow-up type: In-patient    Andres Labrum 02/26/2017, 5:04 PM

## 2017-02-26 NOTE — Anesthesia Preprocedure Evaluation (Signed)
Anesthesia Evaluation  Patient identified by MRN, date of birth, ID band Patient awake    Reviewed: Allergy & Precautions, H&P , NPO status , Patient's Chart, lab work & pertinent test results  History of Anesthesia Complications Negative for: history of anesthetic complications  Airway Mallampati: II  TM Distance: >3 FB Neck ROM: full    Dental no notable dental hx. (+) Teeth Intact   Pulmonary neg pulmonary ROS,    Pulmonary exam normal breath sounds clear to auscultation       Cardiovascular negative cardio ROS Normal cardiovascular exam Rhythm:Regular Rate:Normal     Neuro/Psych negative neurological ROS  negative psych ROS   GI/Hepatic Neg liver ROS, GERD  ,  Endo/Other  Obesity  Renal/GU Hx/o renal calculi     Musculoskeletal   Abdominal (+) + obese,   Peds  Hematology negative hematology ROS (+)   Anesthesia Other Findings   Reproductive/Obstetrics (+) Pregnancy Desires sterilization                             Anesthesia Physical  Anesthesia Plan  ASA: II  Anesthesia Plan: Spinal   Post-op Pain Management:    Induction:   Airway Management Planned: Natural Airway  Additional Equipment:   Intra-op Plan:   Post-operative Plan:   Informed Consent: I have reviewed the patients History and Physical, chart, labs and discussed the procedure including the risks, benefits and alternatives for the proposed anesthesia with the patient or authorized representative who has indicated his/her understanding and acceptance.   Dental advisory given  Plan Discussed with: Anesthesiologist, CRNA and Surgeon  Anesthesia Plan Comments:         Anesthesia Quick Evaluation

## 2017-02-26 NOTE — Op Note (Signed)
Cesarean Section Procedure Note   Emily Mendez  02/26/2017  Indications: Scheduled Proceedure/Maternal Request   Pre-operative Diagnosis: previous X 1, desires sterility.   Post-operative Diagnosis: Same   Surgeon: Surgeon(s) and Role:    * Marylynn Pearson, MD - Primary   Assistants: Jill Side, RNFA  Anesthesia: spinal   Procedure Details:  The patient was seen in the Holding Room. The risks, benefits, complications, treatment options, and expected outcomes were discussed with the patient. The patient concurred with the proposed plan, giving informed consent. identified as [name] and the procedure verified as C-Section Delivery. A Time Out was held and the above information confirmed.  After induction of anesthesia, the patient was draped and prepped in the usual sterile manner. A transverse was made and carried down through the subcutaneous tissue to the fascia. Fascial incision was made and extended transversely. The fascia was separated from the underlying rectus tissue superiorly and inferiorly. The peritoneum was identified and entered. Peritoneal incision was extended longitudinally. The utero-vesical peritoneal reflection was incised transversely and the bladder flap was bluntly freed from the lower uterine segment. A low transverse uterine incision was made. Delivered from cephalic presentation was a viable female infant. The placenta was removed Intact and appeared normal. The uterine outline, tubes and ovaries appeared normal}. The uterine incision was closed with running locked sutures of 0chromic gut.   Hemostasis was observed. Lavage was carried out until clear.  Fallopian tube was grasped with babcock.  Knuckle of tube doubly tied with plain gut suture.  Segment of tube excised and noted to be hemostatic.  This was repeated on opposite side.  Peritoneum closed with 0 monocryl.  The fascia was then reapproximated with running sutures of 0PDS.  The skin was closed with  4-0Vicryl.   Instrument, sponge, and needle counts were correct prior the abdominal closure and were correct at the conclusion of the case.     Estimated Blood Los: 600cc   Urine Output: clear  Specimens: portion of bilateral fallopian tubes  Complications: no complications  Disposition: PACU - hemodynamically stable.   Maternal Condition: stable   Baby condition / location:  Couplet care / Skin to Skin  Attending Attestation: I was present and scrubbed for the entire procedure.   Signed: Surgeon(s): Marylynn Pearson, MD

## 2017-02-26 NOTE — Consult Note (Signed)
Neonatology Note:   Attendance at C-section:    I was asked by Dr. Julien Girt to attend this repeat C/S at term. The mother is a G2P1 A pos, GBS neg with an uncomplicated pregnancy. ROM at delivery, fluid clear. Infant vigorous with good spontaneous cry and tone. Delayed cord clamping was done. Needed no suctioning. Ap 9/9. Lungs clear to ausc in DR. To CN to care of Pediatrician.   Real Cons, MD

## 2017-02-26 NOTE — Anesthesia Postprocedure Evaluation (Signed)
Anesthesia Post Note  Patient: Pricila Bridge  Procedure(s) Performed: Procedure(s) (LRB): CESAREAN SECTION WITH BILATERAL TUBAL LIGATION (Bilateral)  Patient location during evaluation: PACU Anesthesia Type: Spinal Level of consciousness: awake and alert and oriented Pain management: pain level controlled Vital Signs Assessment: post-procedure vital signs reviewed and stable Respiratory status: spontaneous breathing, nonlabored ventilation and respiratory function stable Cardiovascular status: blood pressure returned to baseline and stable Postop Assessment: no signs of nausea or vomiting, no headache, no backache, spinal receding and patient able to bend at knees Anesthetic complications: no        Last Vitals:  Vitals:   02/26/17 1000 02/26/17 1003  BP: (!) 125/104 104/84  Pulse: (!) 57 (!) 50  Resp: 16 14  Temp:      Last Pain:  Vitals:   02/26/17 1000  TempSrc:   PainSc: 6    Pain Goal:                 Alicen Donalson A.

## 2017-02-27 LAB — CBC
HCT: 32.9 % — ABNORMAL LOW (ref 36.0–46.0)
HEMOGLOBIN: 11.5 g/dL — AB (ref 12.0–15.0)
MCH: 31.6 pg (ref 26.0–34.0)
MCHC: 35 g/dL (ref 30.0–36.0)
MCV: 90.4 fL (ref 78.0–100.0)
PLATELETS: 105 10*3/uL — AB (ref 150–400)
RBC: 3.64 MIL/uL — ABNORMAL LOW (ref 3.87–5.11)
RDW: 13.9 % (ref 11.5–15.5)
WBC: 9.3 10*3/uL (ref 4.0–10.5)

## 2017-02-27 NOTE — Progress Notes (Signed)
Subjective: Postpartum Day 1: Cesarean Delivery Patient reports incisional pain, tolerating PO and no problems voiding.    Objective: Vital signs in last 24 hours: Temp:  [97 F (36.1 C)-98.4 F (36.9 C)] 98.4 F (36.9 C) (03/31 0400) Pulse Rate:  [50-78] 78 (03/31 0400) Resp:  [14-20] 18 (03/31 0400) BP: (104-136)/(61-104) 117/73 (03/31 0400) SpO2:  [93 %-99 %] 99 % (03/31 0400)  Physical Exam:  General: alert and cooperative Lochia: appropriate Uterine Fundus: firm Incision: no significant drainage DVT Evaluation: No evidence of DVT seen on physical exam.   Recent Labs  02/25/17 0930 02/27/17 0522  HGB 12.5 11.5*  HCT 36.3 32.9*    Assessment/Plan: Status post Cesarean section. Doing well postoperatively.  Continue current care. Desires circ.  r/b/a discussed, questions answered, informed consent  Emily Mendez 02/27/2017, 9:01 AM

## 2017-02-27 NOTE — Addendum Note (Signed)
Addendum  created 02/27/17 8251 by [name], CRNA   Sign clinical note

## 2017-02-27 NOTE — Anesthesia Postprocedure Evaluation (Signed)
Anesthesia Post Note  Patient: Emily Mendez  Procedure(s) Performed: Procedure(s) (LRB): CESAREAN SECTION WITH BILATERAL TUBAL LIGATION (Bilateral)  Patient location during evaluation: Mother Baby Anesthesia Type: Spinal Level of consciousness: oriented and awake and alert Pain management: pain level controlled Vital Signs Assessment: post-procedure vital signs reviewed and stable Respiratory status: spontaneous breathing, respiratory function stable and patient connected to nasal cannula oxygen Cardiovascular status: blood pressure returned to baseline and stable Postop Assessment: no headache, no backache and patient able to bend at knees Anesthetic complications: no        Last Vitals:  Vitals:   02/27/17 0009 02/27/17 0400  BP: 136/84 117/73  Pulse: 70 78  Resp: 18 18  Temp: 36.3 C 36.9 C    Last Pain:  Vitals:   02/27/17 0648  TempSrc:   PainSc: 4    Pain Goal: Patients Stated Pain Goal: 3 (02/26/17 1753)               [name]

## 2017-02-27 NOTE — Lactation Note (Signed)
This note was copied from a baby's chart. Lactation Consultation Note  Patient Name: Boy Everett Ricciardelli [id number] Date: 02/27/2017 Reason for consult: Follow-up assessment   Follow up with mom of 56 hour old infant. Mom reports BF is not going well. She reports infant will latch but wont stay latch long. She denies nipple pain/tenderness. She denies feeling fuller today. She reports she is alternating breast milk and formula. She declined LC assistance at this time. Enc her to call out for assistance as needed.    Maternal Data Formula Feeding for Exclusion: Yes Reason for exclusion: Mother's choice to formula and breast feed on admission  Feeding    LATCH Score/Interventions                      Lactation Tools Discussed/Used WIC Program: No   Consult Status Consult Status: Follow-up Date: 02/28/17 Follow-up type: In-patient    Debby Freiberg Zilphia Kozinski 02/27/2017, 10:30 PM

## 2017-02-28 MED ORDER — OXYCODONE-ACETAMINOPHEN 5-325 MG PO TABS: 1.0000 | ORAL_TABLET | ORAL | 0 refills | Status: AC | PRN

## 2017-02-28 MED ORDER — IBUPROFEN 600 MG PO TABS: 600.0000 mg | ORAL_TABLET | Freq: Four times a day (QID) | ORAL | 0 refills | Status: AC | PRN

## 2017-02-28 NOTE — Discharge Summary (Signed)
Obstetric Discharge Summary Reason for Admission: cesarean section Prenatal Procedures: ultrasound Intrapartum Procedures: cesarean: low cervical, transverse Postpartum Procedures: none Complications-Operative and Postpartum: none Hemoglobin  Date Value Ref Range Status  02/27/2017 11.5 (L) 12.0 - 15.0 g/dL Final   HCT  Date Value Ref Range Status  02/27/2017 32.9 (L) 36.0 - 46.0 % Final    Physical Exam:  General: alert and cooperative Lochia: appropriate Uterine Fundus: firm Incision: no significant drainage DVT Evaluation: No evidence of DVT seen on physical exam.  Discharge Diagnoses: Term Pregnancy-delivered  Discharge Information: Date: 02/28/2017 Activity: pelvic rest Diet: routine Medications: PNV, Ibuprofen and Percocet Condition: stable Instructions: refer to practice specific booklet Discharge to: home Follow-up Information    [facility]. Schedule an appointment as soon as possible for a visit in 2 week(s).   Contact information: [address] [place] [address] [phone]           Newborn Data: Live born female  Birth Weight: 7 lb 9.3 oz (3440 g) APGAR: 9, 9  Home with mother.  Camryn Quesinberry 02/28/2017, 9:28 AM

## 2017-02-28 NOTE — Plan of Care (Signed)
Problem: Nutritional: Goal: Mothers verbalization of comfort with breastfeeding process will improve Mother plans to formula feed her newborn and will try breastfeeding as baby is interested. She made an effort to latch baby once last night. Patient requested a hand pump and was instructed on use. Lactation Consultant rounded on patient this am. Patient is aware of post hospital lactation ambulatory services and resources for support have been given to patient. Mother reports feeling less stressed with her current infant feeding plan.

## 2017-02-28 NOTE — Lactation Note (Signed)
This note was copied from a baby's chart. Lactation Consultation Note: Mother reports that she is going to try to breastfeed and if it doesn't work , she is ok. At present mother is mostly bottle feeding infant. Mother was informed that [facility] services are available as needed.  Patient Name: Boy Toby Ayad [id number] Date: 02/28/2017 Reason for consult: Follow-up assessment   Maternal Data    Feeding    [facility] Score/Interventions                      Lactation Tools Discussed/Used     Consult Status      Darla Lesches 02/28/2017, 10:39 AM

## 2017-06-10 NOTE — Anesthesia Postprocedure Evaluation (Signed)
Anesthesia Post Note  Patient: Emily Mendez  Procedure(s) Performed: Procedure(s) (LRB): CESAREAN SECTION WITH BILATERAL TUBAL LIGATION (Bilateral)     Anesthesia Post Evaluation  Last Vitals:  Vitals:   02/27/17 0400 02/27/17 1638  BP: 117/73 116/76  Pulse: 78 75  Resp: 18 18  Temp: 36.9 C 36.7 C    Last Pain:  Vitals:   02/28/17 1020  TempSrc:   PainSc: 1                  Haydon Kalmar A.

## 2017-06-10 NOTE — Addendum Note (Signed)
Addendum  created 06/10/17 1556 by Koree Staheli, MD   Sign clinical note    

## 2017-10-20 IMAGING — US US OB COMP LESS 14 WK
1 series · 15 of 28 positions shown · non-contrast
Comparison: None applicable

CLINICAL DATA: Intermittent spotting since 11 a.m.. Quantitative
beta HCG is pending.

LMP was05/27/2016.
Gestational age by LMP is9 weeks 4 days.
EDC by LMP is03/03/2017.
EXAM:
OBSTETRIC <14 WK US AND TRANSVAGINAL OB US
TECHNIQUE: Both transabdominal and transvaginal ultrasound examinations were
performed for complete evaluation of the gestation as well as the
maternal uterus, adnexal regions, and pelvic cul-de-sac.
Transvaginal technique was performed to assess early pregnancy.

[Series 1: us ob comp less 14 wk · 15 of 66 slices shown]
[im 1/66]
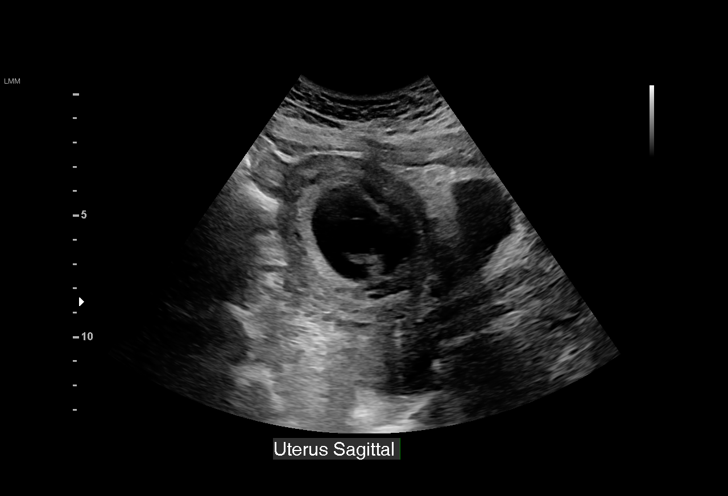
[im 5/66]
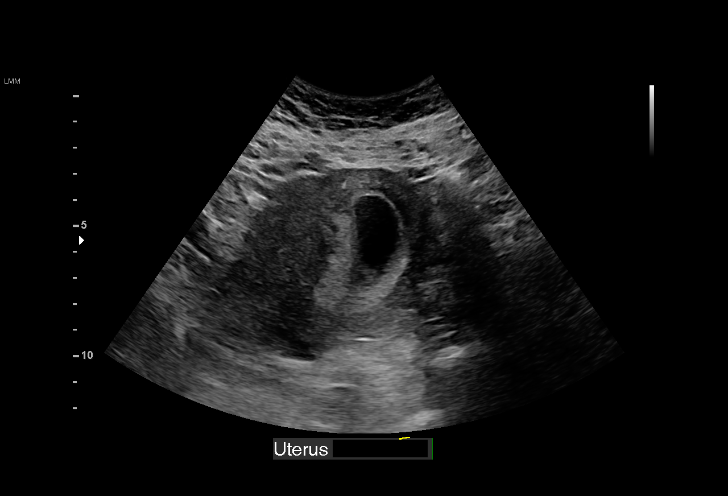
[im 10/66]
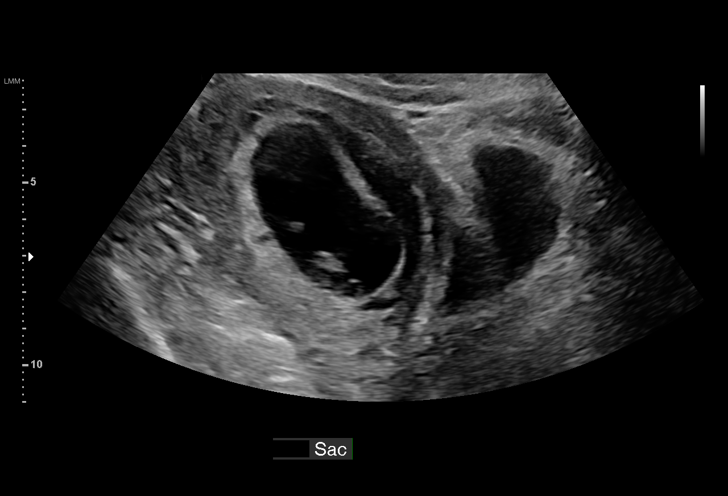
[im 15/66]
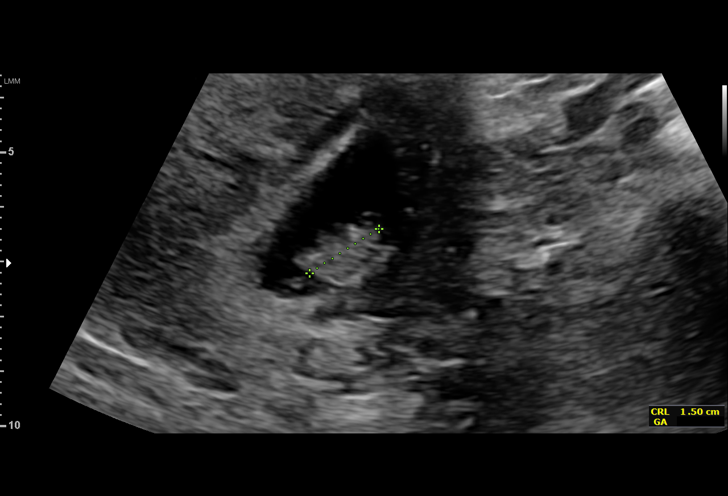
[im 20/66]
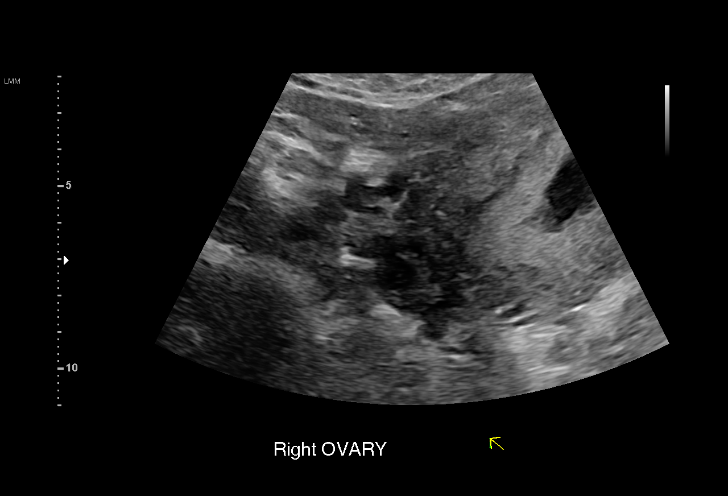
[im 25/66]
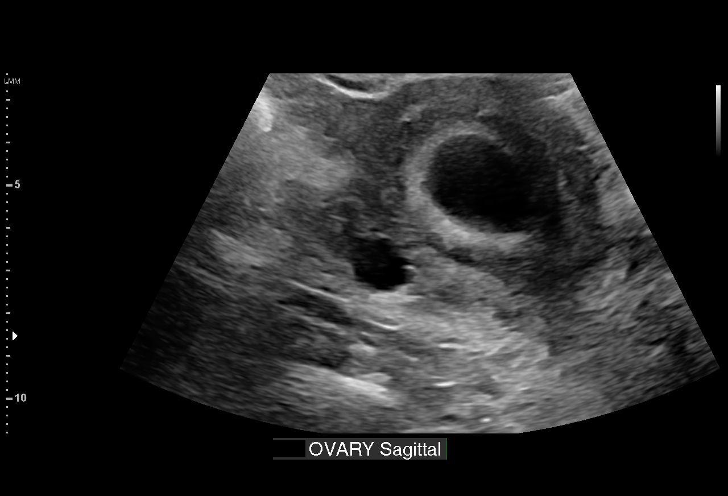
[im 29/66]
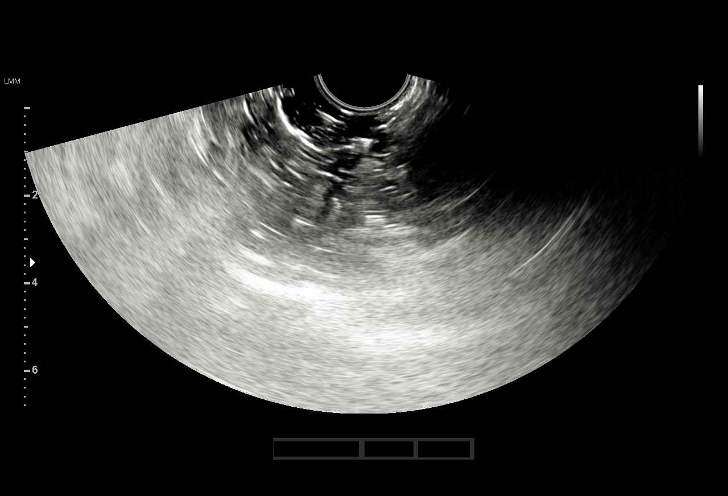
[im 34/66]
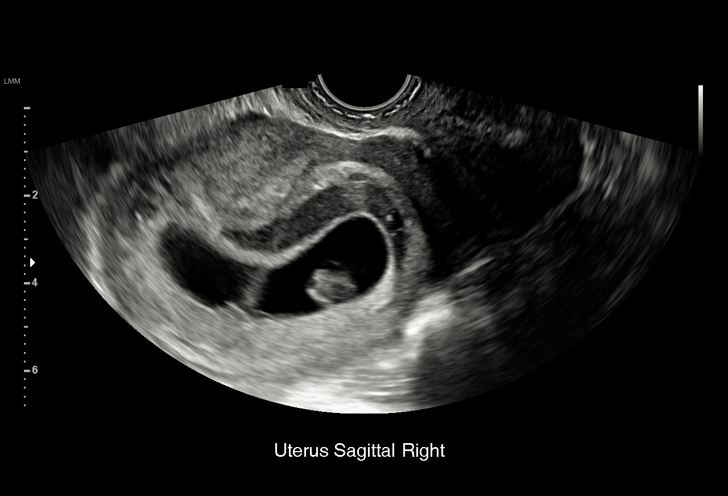
[im 37/66]
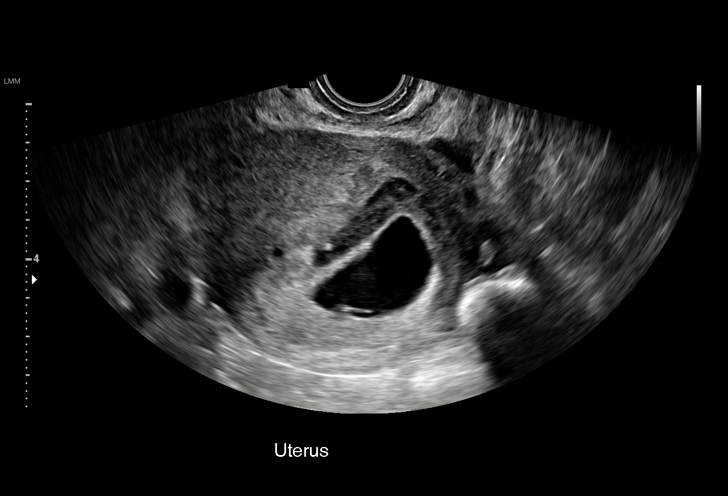
[im 41/66]
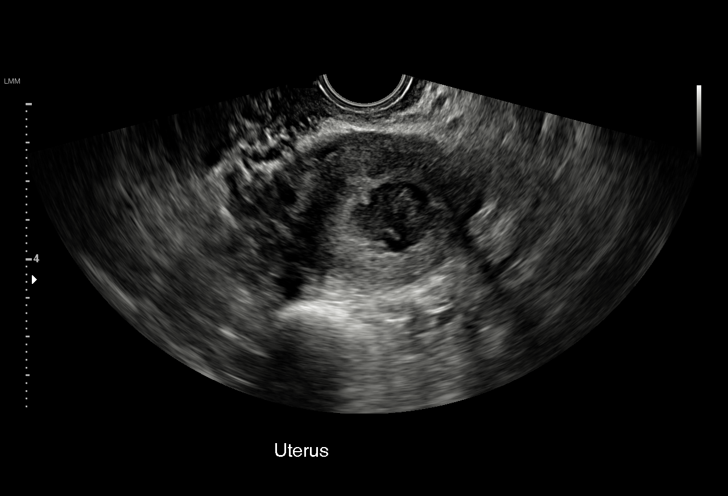
[im 46/66]
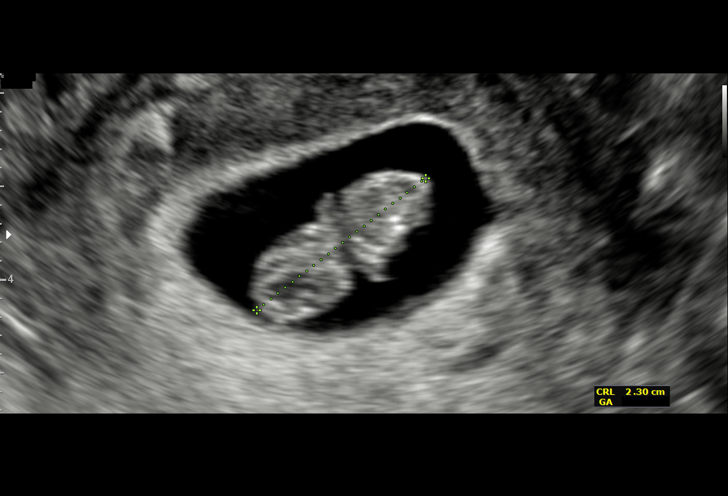
[im 51/66]
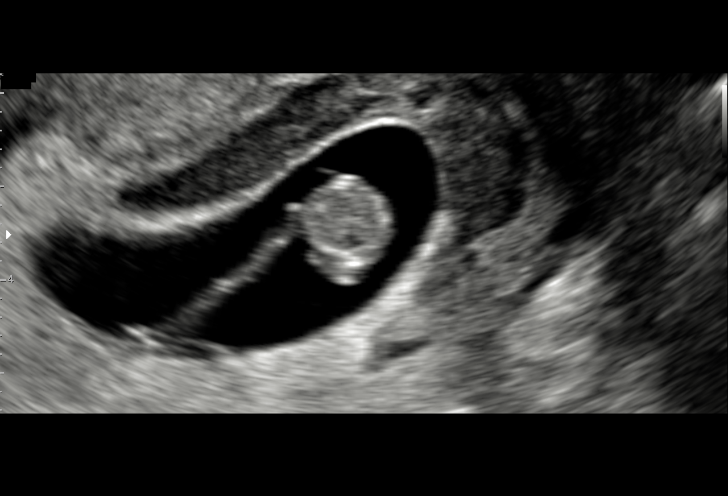
[im 56/66]
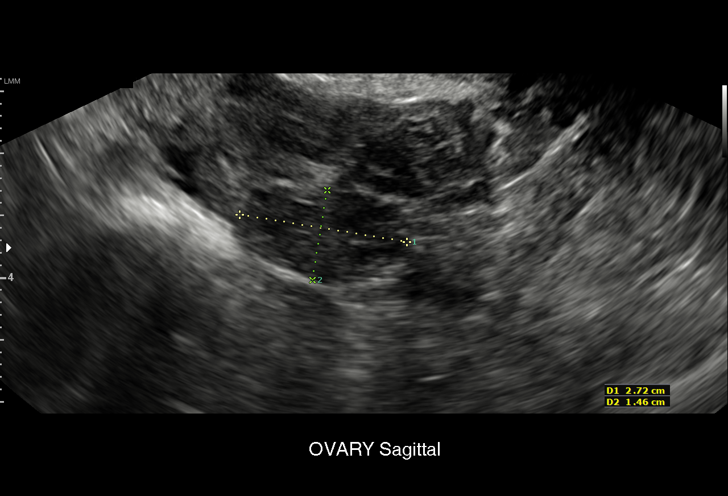
[im 61/66]
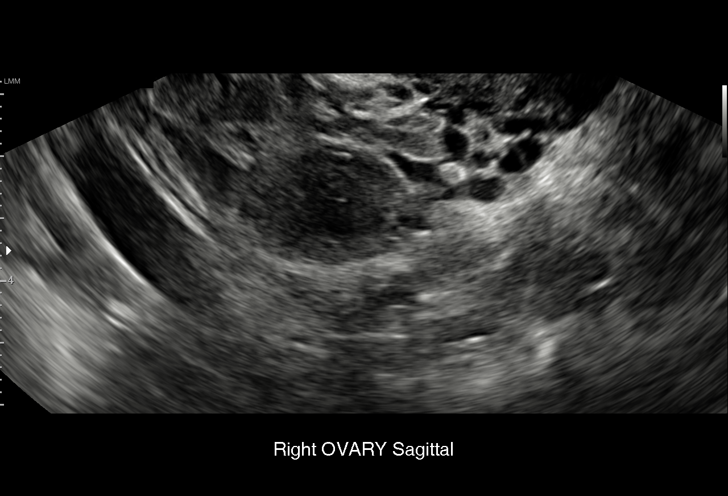
[im 66/66]
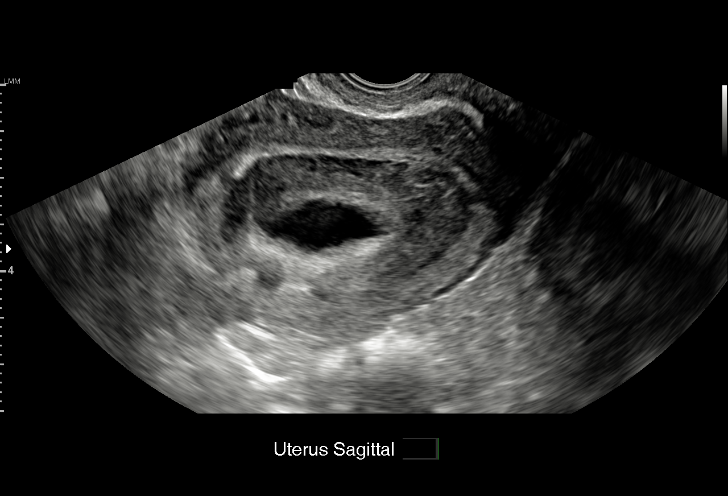

[15 of 28 positions shown; findings below may reference images not displayed]

FINDINGS: Intrauterine gestational sac: Present

Yolk sac:  Present

Embryo:  Present

Cardiac Activity: Present

Heart Rate: 189  bpm

CRL:  23 mm  mm   8 w   6 d                  US EDC: 03/08/2017

Subchorionic hemorrhage:  Large subchorionic hemorrhage is present.

Maternal uterus/adnexae: Ovaries have a normal appearance. No free
pelvic fluid.
IMPRESSION: 1. Single living intrauterine embryo measuring 8 weeks 6 days.
2. By today's exam EDC is 03/08/2017.
3. Large subchorionic hemorrhage noted.

## 2017-12-16 ENCOUNTER — Other Ambulatory Visit
Admission: RE | Admit: 2017-12-16 | Discharge: 2017-12-16 | Disposition: A | Discharge status: Home / Self Care | Payer: BLUE CROSS/BLUE SHIELD | Source: Ambulatory Visit | Attending: Nurse Practitioner | Admitting: Nurse Practitioner

## 2017-12-16 DIAGNOSIS — Z8619 Personal history of other infectious and parasitic diseases: Secondary | ICD-10-CM | POA: Diagnosis not present

## 2017-12-16 LAB — CLOSTRIDIUM DIFFICILE BY PCR, REFLEXED: Toxigenic C. Difficile by PCR: POSITIVE — AB

## 2017-12-16 LAB — C DIFFICILE QUICK SCREEN W PCR REFLEX
C Diff antigen: POSITIVE — AB
C Diff toxin: NEGATIVE

## 2020-05-03 ENCOUNTER — Other Ambulatory Visit: Payer: Self-pay | Admitting: Urology

## 2020-05-14 NOTE — Progress Notes (Signed)
Spoke with patient. Patient stated she had called [facility] and  cancelled her procedure because she passed her stone this morning.

## 2020-05-20 ENCOUNTER — Other Ambulatory Visit (HOSPITAL_COMMUNITY): Payer: BLUE CROSS/BLUE SHIELD

## 2020-05-22 ENCOUNTER — Ambulatory Visit (HOSPITAL_BASED_OUTPATIENT_CLINIC_OR_DEPARTMENT_OTHER): Admission: RE | Admit: 2020-05-22 | Payer: BC Managed Care – PPO | Source: Home / Self Care | Admitting: Urology

## 2020-05-22 SURGERY — CYSTOURETEROSCOPY, WITH RETROGRADE PYELOGRAM AND STENT INSERTION
Anesthesia: General | Laterality: Right

## 2022-07-15 ENCOUNTER — Ambulatory Visit: Payer: BC Managed Care – PPO | Admitting: Dermatology

## 2022-07-15 DIAGNOSIS — L821 Other seborrheic keratosis: Secondary | ICD-10-CM | POA: Diagnosis not present

## 2022-07-15 DIAGNOSIS — L720 Epidermal cyst: Secondary | ICD-10-CM | POA: Diagnosis not present

## 2022-07-15 DIAGNOSIS — L814 Other melanin hyperpigmentation: Secondary | ICD-10-CM | POA: Diagnosis not present

## 2022-07-15 DIAGNOSIS — I781 Nevus, non-neoplastic: Secondary | ICD-10-CM | POA: Diagnosis not present

## 2022-07-15 DIAGNOSIS — L578 Other skin changes due to chronic exposure to nonionizing radiation: Secondary | ICD-10-CM

## 2022-07-15 DIAGNOSIS — D2371 Other benign neoplasm of skin of right lower limb, including hip: Secondary | ICD-10-CM

## 2022-07-15 DIAGNOSIS — D1800 Hemangioma unspecified site: Secondary | ICD-10-CM

## 2022-07-15 DIAGNOSIS — D229 Melanocytic nevi, unspecified: Secondary | ICD-10-CM

## 2022-07-15 NOTE — Patient Instructions (Signed)
Recommend taking Heliocare sun protection supplement daily in sunny weather for additional sun protection. For maximum protection on the sunniest days, you can take up to 2 capsules of regular Heliocare OR take 1 capsule of Heliocare Ultra. For prolonged exposure (such as a full day in the sun), you can repeat your dose of the supplement 4 hours after your first dose. Heliocare can be purchased at [facility], at some [facility] or at www.heliocare.com.    Melanoma ABCDEs  Melanoma is the most dangerous type of skin cancer, and is the leading cause of death from skin disease.  You are more likely to develop melanoma if you: Have light-colored skin, light-colored eyes, or red or blond hair Spend a lot of time in the sun Tan regularly, either outdoors or in a tanning bed Have had blistering sunburns, especially during childhood Have a close family member who has had a melanoma Have atypical moles or large birthmarks  Early detection of melanoma is key since treatment is typically straightforward and cure rates are extremely high if we catch it early.   The first sign of melanoma is often a change in a mole or a new dark spot.  The ABCDE system is a way of remembering the signs of melanoma.  A for asymmetry:  The two halves do not match. B for border:  The edges of the growth are irregular. C for color:  A mixture of colors are present instead of an even brown color. D for diameter:  Melanomas are usually (but not always) greater than 6mm - the size of a pencil eraser. E for evolution:  The spot keeps changing in size, shape, and color.  Please check your skin once per month between visits. You can use a small mirror in front and a large mirror behind you to keep an eye on the back side or your body.   If you see any new or changing lesions before your next follow-up, please call to schedule a visit.  Please continue daily skin protection including broad spectrum sunscreen SPF 30+ to  sun-exposed areas, reapplying every 2 hours as needed when you're outdoors.    Due to recent changes in healthcare laws, you may see results of your pathology and/or laboratory studies on MyChart before the doctors have had a chance to review them. We understand that in some cases there may be results that are confusing or concerning to you. Please understand that not all results are received at the same time and often the doctors may need to interpret multiple results in order to provide you with the best plan of care or course of treatment. Therefore, we ask that you please give us 2 business days to thoroughly review all your results before contacting the office for clarification. Should we see a critical lab result, you will be contacted sooner.   If You Need Anything After Your Visit  If you have any questions or concerns for your doctor, please call our main line at [phone] and press option 4 to reach your doctor's medical assistant. If no one answers, please leave a voicemail as directed and we will return your call as soon as possible. Messages left after 4 pm will be answered the following business day.   You may also send us a message via MyChart. We typically respond to MyChart messages within 1-2 business days.  For prescription refills, please ask your pharmacy to contact our office. Our fax number is [phone].  If you have   an urgent issue when the clinic is closed that cannot wait until the next business day, you can page your doctor at the number below.    Please note that while we do our best to be available for urgent issues outside of office hours, we are not available 24/7.   If you have an urgent issue and are unable to reach us, you may choose to seek medical care at your doctor's office, retail clinic, urgent care center, or emergency room.  If you have a medical emergency, please immediately call 911 or go to the emergency department.  Pager Numbers  - Dr.  Kowalski: [phone]  - Dr. Moye: [phone]  - Dr. Stewart: [phone]  In the event of inclement weather, please call our main line at [phone] for an update on the status of any delays or closures.  Dermatology Medication Tips: Please keep the boxes that topical medications come in in order to help keep track of the instructions about where and how to use these. Pharmacies typically print the medication instructions only on the boxes and not directly on the medication tubes.   If your medication is too expensive, please contact our office at [phone] option 4 or send us a message through MyChart.   We are unable to tell what your co-pay for medications will be in advance as this is different depending on your insurance coverage. However, we may be able to find a substitute medication at lower cost or fill out paperwork to get insurance to cover a needed medication.   If a prior authorization is required to get your medication covered by your insurance company, please allow us 1-2 business days to complete this process.  Drug prices often vary depending on where the prescription is filled and some pharmacies may offer cheaper prices.  The website www.goodrx.com contains coupons for medications through different pharmacies. The prices here do not account for what the cost may be with help from insurance (it may be cheaper with your insurance), but the website can give you the price if you did not use any insurance.  - You can print the associated coupon and take it with your prescription to the pharmacy.  - You may also stop by our office during regular business hours and pick up a GoodRx coupon card.  - If you need your prescription sent electronically to a different pharmacy, notify our office through [place] MyChart or by phone at [phone] option 4.     Si Usted Necesita Algo Despus de Su Visita  Tambin puede enviarnos un mensaje a travs de MyChart. Por lo  general respondemos a los mensajes de MyChart en el transcurso de 1 a 2 das hbiles.  Para renovar recetas, por favor pida a su farmacia que se ponga en contacto con nuestra oficina. Nuestro nmero de fax es el [phone].  Si tiene un asunto urgente cuando la clnica est cerrada y que no puede esperar hasta el siguiente da hbil, puede llamar/localizar a su doctor(a) al nmero que aparece a continuacin.   Por favor, tenga en cuenta que aunque hacemos todo lo posible para estar disponibles para asuntos urgentes fuera del horario de oficina, no estamos disponibles las 24 horas del da, los 7 das de la semana.   Si tiene un problema urgente y no puede comunicarse con nosotros, puede optar por buscar atencin mdica  en el consultorio de su doctor(a), en una clnica privada, en un centro de atencin urgente o en una sala de   emergencias.  Si tiene una emergencia mdica, por favor llame inmediatamente al 911 o vaya a la sala de emergencias.  Nmeros de bper  - Dr. Kowalski: [phone]  - Dra. Moye: [phone]  - Dra. Stewart: [phone]  En caso de inclemencias del tiempo, por favor llame a nuestra lnea principal al [phone] para una actualizacin sobre el estado de cualquier retraso o cierre.  Consejos para la medicacin en dermatologa: Por favor, guarde las cajas en las que vienen los medicamentos de uso tpico para ayudarle a seguir las instrucciones sobre dnde y cmo usarlos. Las farmacias generalmente imprimen las instrucciones del medicamento slo en las cajas y no directamente en los tubos del medicamento.   Si su medicamento es muy caro, por favor, pngase en contacto con nuestra oficina llamando al [phone] y presione la opcin 4 o envenos un mensaje a travs de MyChart.   No podemos decirle cul ser su copago por los medicamentos por adelantado ya que esto es diferente dependiendo de la cobertura de su seguro. Sin embargo, es posible que podamos encontrar un  medicamento sustituto a menor costo o llenar un formulario para que el seguro cubra el medicamento que se considera necesario.   Si se requiere una autorizacin previa para que su compaa de seguros cubra su medicamento, por favor permtanos de 1 a 2 das hbiles para completar este proceso.  Los precios de los medicamentos varan con frecuencia dependiendo del lugar de dnde se surte la receta y alguna farmacias pueden ofrecer precios ms baratos.  El sitio web www.goodrx.com tiene cupones para medicamentos de diferentes farmacias. Los precios aqu no tienen en cuenta lo que podra costar con la ayuda del seguro (puede ser ms barato con su seguro), pero el sitio web puede darle el precio si no utiliz ningn seguro.  - Puede imprimir el cupn correspondiente y llevarlo con su receta a la farmacia.  - Tambin puede pasar por nuestra oficina durante el horario de atencin regular y recoger una tarjeta de cupones de GoodRx.  - Si necesita que su receta se enve electrnicamente a una farmacia diferente, informe a nuestra oficina a travs de MyChart de [place] o por telfono llamando al [phone] y presione la opcin 4.  

## 2022-07-15 NOTE — Progress Notes (Signed)
Follow-Up Visit   Subjective  Emily Mendez is a 41 y.o. female who presents for the following: Annual Exam (The patient presents for Total-Body Skin Exam (TBSE) for skin cancer screening and mole check.  The patient has spots, moles and lesions to be evaluated, some may be new or changing and the patient has concerns that these could be cancer. Patient with no personal hx of skin cancer. ).  Patient's parents have both recently been treated for possible atypical moles.   The following portions of the chart were reviewed this encounter and updated as appropriate:   Tobacco  Allergies  Meds  Problems  Med Hx  Surg Hx  Fam Hx      Review of Systems:  No other skin or systemic complaints except as noted in HPI or Assessment and Plan.  Objective  Well appearing patient in no apparent distress; mood and affect are within normal limits.  A full examination was performed including scalp, head, eyes, ears, nose, lips, neck, chest, axillae, abdomen, back, buttocks, bilateral upper extremities, bilateral lower extremities, hands, feet, fingers, toes, fingernails, and toenails. All findings within normal limits unless otherwise noted below.  Left Jaw Dilated blood vessel without features suspicious for malignancy on dermoscopy   Right Axilla Subcutaneous nodule.     Assessment & Plan  Telangiectasia Left Jaw  Benign-appearing.  Observation.  Call clinic for new or changing lesions.  Recommend daily use of broad spectrum spf 30+ sunscreen to sun-exposed areas.    Epidermal inclusion cyst Right Axilla  Not bothersome for patient.  Benign-appearing. Exam most consistent with an epidermal inclusion cyst. Discussed that a cyst is a benign growth that can grow over time and sometimes get irritated or inflamed. Recommend observation if it is not bothersome. Discussed option of surgical excision to remove it if it is growing, symptomatic, or other changes noted. Please call for new  or changing lesions so they can be evaluated.     Lentigines - Scattered tan macules - Due to sun exposure - Benign-appearing, observe - Recommend daily broad spectrum sunscreen SPF 30+ to sun-exposed areas, reapply every 2 hours as needed. - Call for any changes  Seborrheic Keratoses - Stuck-on, waxy, tan-brown papules and/or plaques  - Benign-appearing - Discussed benign etiology and prognosis. - Observe - Call for any changes  Melanocytic Nevi - Tan-brown and/or pink-flesh-colored symmetric macules and papules - Benign appearing on exam today - Observation - Call clinic for new or changing moles - Recommend daily use of broad spectrum spf 30+ sunscreen to sun-exposed areas.   Hemangiomas - Red papules - Discussed benign nature - Observe - Call for any changes  Actinic Damage - Chronic condition, secondary to cumulative UV/sun exposure - diffuse scaly erythematous macules with underlying dyspigmentation - Recommend daily broad spectrum sunscreen SPF 30+ to sun-exposed areas, reapply every 2 hours as needed.  - Staying in the shade or wearing long sleeves, sun glasses (UVA+UVB protection) and wide brim hats (4-inch brim around the entire circumference of the hat) are also recommended for sun protection.  - Call for new or changing lesions.  Skin cancer screening performed today.  Dermatofibroma - Firm pink/brown papulenodule with dimple sign at right posterior thigh - Benign appearing - Call for any changes  Return in about 1 year (around 07/16/2023) for TBSE.  Graciella Belton, RMA, am acting as scribe for Forest Gleason, MD .  Documentation: I have reviewed the above documentation for accuracy and completeness, and I agree with the  above.  Forest Gleason, MD

## 2022-07-25 ENCOUNTER — Encounter: Payer: Self-pay | Admitting: Dermatology

## 2023-07-29 ENCOUNTER — Encounter: Payer: BC Managed Care – PPO | Admitting: Dermatology

## 2023-09-06 ENCOUNTER — Encounter: Payer: Self-pay | Admitting: Obstetrics and Gynecology

## 2023-09-06 ENCOUNTER — Other Ambulatory Visit: Payer: Self-pay | Admitting: Obstetrics and Gynecology

## 2023-09-06 DIAGNOSIS — R928 Other abnormal and inconclusive findings on diagnostic imaging of breast: Secondary | ICD-10-CM

## 2023-09-06 DIAGNOSIS — N6489 Other specified disorders of breast: Secondary | ICD-10-CM

## 2023-09-10 ENCOUNTER — Encounter: Payer: Self-pay | Admitting: Radiology

## 2023-09-10 ENCOUNTER — Ambulatory Visit
Admission: RE | Admit: 2023-09-10 | Discharge: 2023-09-10 | Disposition: A | Discharge status: Home / Self Care | Payer: BC Managed Care – PPO | Source: Ambulatory Visit | Attending: Obstetrics and Gynecology | Admitting: Obstetrics and Gynecology

## 2023-09-10 DIAGNOSIS — N6489 Other specified disorders of breast: Secondary | ICD-10-CM

## 2023-09-14 ENCOUNTER — Other Ambulatory Visit: Payer: BC Managed Care – PPO

## 2023-09-17 ENCOUNTER — Inpatient Hospital Stay: Admission: RE | Admit: 2023-09-17 | Payer: BC Managed Care – PPO | Source: Ambulatory Visit

## 2023-09-17 ENCOUNTER — Other Ambulatory Visit: Payer: BC Managed Care – PPO

## 2024-06-06 ENCOUNTER — Other Ambulatory Visit: Payer: Self-pay | Admitting: Obstetrics and Gynecology

## 2024-06-06 DIAGNOSIS — Z1231 Encounter for screening mammogram for malignant neoplasm of breast: Secondary | ICD-10-CM

## 2024-07-13 ENCOUNTER — Ambulatory Visit: Admission: RE | Admit: 2024-07-13 | Discharge: 2024-07-13 | Disposition: A | Discharge status: Home / Self Care

## 2024-07-13 VITALS — BP 141/84 | HR 99 | Temp 97.8°F | Resp 18

## 2024-07-13 DIAGNOSIS — K219 Gastro-esophageal reflux disease without esophagitis: Secondary | ICD-10-CM

## 2024-07-13 DIAGNOSIS — R079 Chest pain, unspecified: Secondary | ICD-10-CM | POA: Diagnosis not present

## 2024-07-13 NOTE — Discharge Instructions (Addendum)
 You should go to the emergency department for evaluation of your chest pain.    Follow-up with your primary care provider tomorrow.

## 2024-07-13 NOTE — ED Triage Notes (Signed)
 Patient to Urgent Care with complaints of anxiety- describes right sided, upper chest pressure and burning. Reports concerns about acid reflux or a pulled muscle.  Symptoms x2-3 days. Hx of acid reflux and this feels the same.  Takes nexium and prilosec.

## 2024-07-13 NOTE — ED Provider Notes (Signed)
 Emily Mendez    CSN: [id number] Arrival date & time: 07/13/24  1606      History   Chief Complaint Chief Complaint  Patient presents with   Anxiety    Chest pressure and burning feeling. Not sure if it's anxiety or acid reflux - Entered by patient    HPI Emily Mendez is a 43 y.o. female.  Patient presents with burning sensation in her upper chest along her esophagus, mostly on her right side.  She denies shortness of breath, nausea, vomiting, dizziness, weakness, numbness.  She reports history of GERD and states this is similar feeling.  She takes omeprazole in the morning and famotidine in the evening.  The history is provided by the patient and medical records.    Past Medical History:  Diagnosis Date   Abnormal Pap smear    History of kidney stones    Hx of varicella    Vaginal Pap smear, abnormal     Patient Active Problem List   Diagnosis Date Noted   S/P cesarean section 02/26/2017   Cesarean delivery delivered 08/08/2011    Past Surgical History:  Procedure Laterality Date   CESAREAN SECTION  08/07/2011   Procedure: CESAREAN SECTION;  Surgeon: Truman Corona;  Location: [facility];  Service: Gynecology;  Laterality: N/A;   CESAREAN SECTION WITH BILATERAL TUBAL LIGATION Bilateral 02/26/2017   Procedure: CESAREAN SECTION WITH BILATERAL TUBAL LIGATION;  Surgeon: Truman Corona, MD;  Location: [facility];  Service: Obstetrics;  Laterality: Bilateral;  Need RNFA    OB History     Gravida  2   Para  2   Term  2   Preterm      AB      Living  2      SAB      IAB      Ectopic      Multiple  0   Live Births  2            Home Medications    Prior to Admission medications   Medication Sig Start Date End Date Taking? Authorizing Provider  acetaminophen  (TYLENOL ) 500 MG tablet Take 500 mg by mouth every 8 (eight) hours as needed for mild pain or headache.    [provider]  calcium carbonate (TUMS - DOSED IN MG  ELEMENTAL CALCIUM) 500 MG chewable tablet Chew 1 tablet by mouth 3 (three) times daily as needed for indigestion or heartburn.    [provider]  cetirizine (ZYRTEC) 10 MG tablet Take 10 mg by mouth.    [provider]  diphenhydrAMINE  (BENADRYL ) 25 MG tablet Take 25 mg by mouth every 6 (six) hours as needed for allergies.    [provider]  ibuprofen  (ADVIL ,MOTRIN ) 600 MG tablet Take 1 tablet (600 mg total) by mouth every 6 (six) hours as needed for mild pain. 02/28/17   Corona Truman, MD  LO LOESTRIN FE 1 MG-10 MCG / 10 MCG tablet Take 1 tablet by mouth daily. Patient not taking: Reported on 07/13/2024 04/17/22   [provider]  montelukast (SINGULAIR) 10 MG tablet Take 10 mg by mouth daily.    [provider]  omeprazole (PRILOSEC) 20 MG capsule Take 20 mg by mouth.    [provider]  oxyCODONE -acetaminophen  (PERCOCET/ROXICET) 5-325 MG tablet Take 1-2 tablets by mouth every 4 (four) hours as needed (pain scale 4-7). Patient not taking: Reported on 07/13/2024 02/28/17   Corona Truman, MD  prenatal vitamin w/FE, FA (PRENATAL  1 + 1) 27-1 MG TABS Take 1 tablet by mouth daily.   Patient not taking: Reported on 07/13/2024    [provider]  sertraline (ZOLOFT) 50 MG tablet Take 50 mg by mouth daily. Patient not taking: Reported on 07/13/2024 06/24/22   [provider]  Ammon Hoof (PREPARATION H TOTABLES WIPES EX) Apply 1 application topically daily as needed (hemorrhoids).  Patient not taking: Reported on 07/13/2024    [provider]    Family History Family History  Problem Relation Age of Onset   Breast cancer Mother 61   Colitis Mother    Cancer Mother        breast   Nephrolithiasis Maternal Grandfather     Social History Social History   Tobacco Use   Smoking status: Never   Smokeless tobacco: Never  Substance Use Topics   Alcohol use: No   Drug use: No     Allergies   Patient has no known  allergies.   Review of Systems Review of Systems  Constitutional:  Negative for chills and fever.  Respiratory:  Negative for cough and shortness of breath.   Cardiovascular:  Positive for chest pain. Negative for palpitations and leg swelling.  Gastrointestinal:  Negative for nausea and vomiting.  Neurological:  Negative for dizziness, syncope, weakness and numbness.     Physical Exam Triage Vital Signs ED Triage Vitals  Encounter Vitals Group     BP 07/13/24 1631 (!) 141/84     Girls Systolic BP Percentile --      Girls Diastolic BP Percentile --      Boys Systolic BP Percentile --      Boys Diastolic BP Percentile --      Pulse Rate 07/13/24 1628 99     Resp 07/13/24 1628 18     Temp 07/13/24 1628 97.8 F (36.6 C)     Temp src --      SpO2 07/13/24 1628 98 %     Weight --      Height --      Head Circumference --      Peak Flow --      Pain Score 07/13/24 1619 3     Pain Loc --      Pain Education --      Exclude from Growth Chart --    No data found.  Updated Vital Signs BP (!) 141/84   Pulse 99   Temp 97.8 F (36.6 C)   Resp 18   LMP 06/29/2024   SpO2 98%   Visual Acuity Right Eye Distance:   Left Eye Distance:   Bilateral Distance:    Right Eye Near:   Left Eye Near:    Bilateral Near:     Physical Exam Constitutional:      General: She is not in acute distress. HENT:     Mouth/Throat:     Mouth: Mucous membranes are moist.  Cardiovascular:     Rate and Rhythm: Normal rate and regular rhythm.     Heart sounds: Normal heart sounds.  Pulmonary:     Effort: Pulmonary effort is normal. No respiratory distress.     Breath sounds: Normal breath sounds.  Abdominal:     General: Bowel sounds are normal.     Palpations: Abdomen is soft.     Tenderness: There is no abdominal tenderness. There is no guarding or rebound.  Neurological:     Mental Status: She is alert.  UC Treatments / Results  Labs (all labs ordered are listed, but only  abnormal results are displayed) Labs Reviewed - No data to display  EKG   Radiology No results found.  Procedures Procedures (including critical care time)  Medications Ordered in UC Medications - No data to display  Initial Impression / Assessment and Plan / UC Course  I have reviewed the triage vital signs and the nursing notes.  Pertinent labs & imaging results that were available during my care of the patient were reviewed by me and considered in my medical decision making (see chart for details).   Chest pain, GERD.  Afebrile and vital signs are stable.  Discussed limitations of evaluation of her symptoms in an urgent care setting.  Patient declines transfer to the ED.  EKG shows sinus rhythm, rate 97, no ST elevation, compared to previous from 2016.  Again discussed with patient the risk for cardiovascular event and limitations of evaluation in an urgent care setting but she declines transfer to the ED.  Instructed her to follow-up with her PCP tomorrow.  Education provided on chest pain and GERD.  ED precautions again given.  Patient agrees to plan of care.  Final Clinical Impressions(s) / UC Diagnoses   Final diagnoses:  Chest pain, unspecified type  Gastroesophageal reflux disease, unspecified whether esophagitis present     Discharge Instructions      You should go to the emergency department for evaluation of your chest pain.    Follow-up with your primary care provider tomorrow.     ED Prescriptions   None    PDMP not reviewed this encounter.   Corlis Burnard DEL, NP 07/13/24 [phone]

## 2024-08-30 ENCOUNTER — Encounter: Payer: Self-pay | Admitting: Obstetrics and Gynecology

## 2024-08-30 ENCOUNTER — Ambulatory Visit
Admission: RE | Admit: 2024-08-30 | Discharge: 2024-08-30 | Disposition: A | Discharge status: Home / Self Care | Source: Ambulatory Visit | Attending: Obstetrics and Gynecology | Admitting: Obstetrics and Gynecology

## 2024-08-30 ENCOUNTER — Ambulatory Visit
Admission: RE | Admit: 2024-08-30 | Discharge: 2024-08-30 | Disposition: A | Source: Ambulatory Visit | Attending: Obstetrics and Gynecology | Admitting: Obstetrics and Gynecology

## 2024-08-30 ENCOUNTER — Other Ambulatory Visit: Payer: Self-pay | Admitting: Obstetrics and Gynecology

## 2024-08-30 DIAGNOSIS — Z1231 Encounter for screening mammogram for malignant neoplasm of breast: Secondary | ICD-10-CM | POA: Diagnosis present

## 2024-08-30 DIAGNOSIS — R928 Other abnormal and inconclusive findings on diagnostic imaging of breast: Secondary | ICD-10-CM

## 2024-08-30 DIAGNOSIS — N6459 Other signs and symptoms in breast: Secondary | ICD-10-CM

## 2024-11-15 ENCOUNTER — Encounter: Admitting: Dietician
